# Patient Record
Sex: Female | Born: 1985 | Hispanic: No | Marital: Married | Smoking: Never smoker
Health system: Southern US, Community
[De-identification: ages and names within clinical notes are randomized; demographics above are authoritative.]

## PROBLEM LIST (undated history)

## (undated) ENCOUNTER — Inpatient Hospital Stay (HOSPITAL_COMMUNITY): Payer: Self-pay

## (undated) DIAGNOSIS — Z8669 Personal history of other diseases of the nervous system and sense organs: Secondary | ICD-10-CM

## (undated) DIAGNOSIS — Z862 Personal history of diseases of the blood and blood-forming organs and certain disorders involving the immune mechanism: Secondary | ICD-10-CM

## (undated) DIAGNOSIS — A0472 Enterocolitis due to Clostridium difficile, not specified as recurrent: Secondary | ICD-10-CM

## (undated) DIAGNOSIS — D649 Anemia, unspecified: Secondary | ICD-10-CM

## (undated) DIAGNOSIS — F32A Depression, unspecified: Secondary | ICD-10-CM

## (undated) DIAGNOSIS — R51 Headache: Secondary | ICD-10-CM

## (undated) DIAGNOSIS — R519 Headache, unspecified: Secondary | ICD-10-CM

## (undated) DIAGNOSIS — B2790 Infectious mononucleosis, unspecified without complication: Secondary | ICD-10-CM

## (undated) DIAGNOSIS — L409 Psoriasis, unspecified: Secondary | ICD-10-CM

## (undated) DIAGNOSIS — F329 Major depressive disorder, single episode, unspecified: Secondary | ICD-10-CM

## (undated) DIAGNOSIS — T7840XA Allergy, unspecified, initial encounter: Secondary | ICD-10-CM

## (undated) HISTORY — DX: Major depressive disorder, single episode, unspecified: F32.9

## (undated) HISTORY — PX: WISDOM TOOTH EXTRACTION: SHX21

## (undated) HISTORY — DX: Personal history of diseases of the blood and blood-forming organs and certain disorders involving the immune mechanism: Z86.2

## (undated) HISTORY — DX: Personal history of other diseases of the nervous system and sense organs: Z86.69

## (undated) HISTORY — DX: Allergy, unspecified, initial encounter: T78.40XA

## (undated) HISTORY — DX: Infectious mononucleosis, unspecified without complication: B27.90

## (undated) HISTORY — DX: Psoriasis, unspecified: L40.9

## (undated) HISTORY — DX: Depression, unspecified: F32.A

---
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
## 2008-06-03 DIAGNOSIS — B2790 Infectious mononucleosis, unspecified without complication: Secondary | ICD-10-CM

## 2008-06-03 HISTORY — DX: Infectious mononucleosis, unspecified without complication: B27.90

## 2008-12-01 HISTORY — PX: BREAST ENHANCEMENT SURGERY: SHX7

## 2009-08-05 ENCOUNTER — Ambulatory Visit: Payer: Self-pay | Admitting: Family Medicine

## 2009-08-05 DIAGNOSIS — R51 Headache: Secondary | ICD-10-CM | POA: Insufficient documentation

## 2009-08-05 DIAGNOSIS — R197 Diarrhea, unspecified: Secondary | ICD-10-CM

## 2009-08-05 DIAGNOSIS — F329 Major depressive disorder, single episode, unspecified: Secondary | ICD-10-CM | POA: Insufficient documentation

## 2009-08-05 DIAGNOSIS — R519 Headache, unspecified: Secondary | ICD-10-CM | POA: Insufficient documentation

## 2009-08-05 DIAGNOSIS — J029 Acute pharyngitis, unspecified: Secondary | ICD-10-CM | POA: Insufficient documentation

## 2009-08-05 DIAGNOSIS — F411 Generalized anxiety disorder: Secondary | ICD-10-CM | POA: Insufficient documentation

## 2009-08-06 ENCOUNTER — Encounter: Payer: Self-pay | Admitting: Family Medicine

## 2009-08-06 LAB — CONVERTED CEMR LAB
Alkaline Phosphatase: 51 units/L (ref 39–117)
Basophils Absolute: 0 10*3/uL (ref 0.0–0.1)
Creatinine, Ser: 0.77 mg/dL (ref 0.40–1.20)
Eosinophils Relative: 3 % (ref 0–5)
Glucose, Bld: 96 mg/dL (ref 70–99)
HCT: 40.8 % (ref 36.0–46.0)
Lymphocytes Relative: 21 % (ref 12–46)
Neutro Abs: 5 10*3/uL (ref 1.7–7.7)
Platelets: 251 10*3/uL (ref 150–400)
RDW: 13.6 % (ref 11.5–15.5)
Sodium: 141 meq/L (ref 135–145)
Total Bilirubin: 0.2 mg/dL — ABNORMAL LOW (ref 0.3–1.2)
Total Protein: 6.2 g/dL (ref 6.0–8.3)

## 2009-08-07 ENCOUNTER — Encounter: Payer: Self-pay | Admitting: Family Medicine

## 2009-08-13 ENCOUNTER — Encounter: Payer: Self-pay | Admitting: Family Medicine

## 2010-07-03 NOTE — Miscellaneous (Signed)
Summary: Directives and Consents  Directives and Consents   Imported By: Joanne Chars CMA 08/13/2009 17:03:02  _____________________________________________________________________  External Attachment:    Type:   Image     Comment:   External Document

## 2010-07-03 NOTE — Assessment & Plan Note (Signed)
Summary: Ear pain-Both/popping, sinus pressure x 2 dys rm 3   Vital Signs:  Patient Profile:   25 Years Old Female CC:      Cold & URI symptoms Height:     66 inches Weight:      111 pounds O2 Sat:      100 % O2 treatment:    Room Air Temp:     97.1 degrees F oral Pulse rate:   85 / minute Pulse rhythm:   regular Resp:     16 per minute BP sitting:   111 / 72  (right arm) Cuff size:   regular  Vitals Entered By: Areta Haber CMA (August 05, 2009 9:17 AM)                  Current Allergies (reviewed today): ! NEOMYCIN    History of Present Illness Chief Complaint: Cold & URI symptoms History of Present Illness: Subjective:  Patient reports that she was treated with two courses of antibiotic about 2 months ago for presumed strep throat.  She subsequently developed bloody stools and was empirically treated with Flagyl which she finished recently.  During the past month she has had recurring right upper quadrant pain.  She reports that her bloody stools ceased after finishing the Flagyl. About 3 days ago she developed fever/chills followed by a recurrent sore throat.  She has again developed diarrhea and has noted blood in her stool.  No nasal congestion, cough, GU, or GYN symptoms   Current Problems: DIARRHEA, BLOODY (ICD-787.91) ACUTE PHARYNGITIS (ICD-462) FAMILY HISTORY DEPRESSION (ICD-V17.0) HEADACHE (ICD-784.0) DEPRESSION (ICD-311) ANXIETY (ICD-300.00)   Current Meds PRISTIQ 50 MG XR24H-TAB (DESVENLAFAXINE SUCCINATE) 1 tab by mouth once daily TOPAMAX 100 MG TABS (TOPIRAMATE) 1 tab by mouth once daily  REVIEW OF SYSTEMS Constitutional Symptoms      Denies fever, chills, night sweats, weight loss, weight gain, and fatigue.  Eyes       Denies change in vision, eye pain, eye discharge, glasses, contact lenses, and eye surgery. Ear/Nose/Throat/Mouth       Complains of ear pain and sinus problems.      Denies hearing loss/aids, change in hearing, ear discharge,  dizziness, frequent runny nose, frequent nose bleeds, sore throat, hoarseness, and tooth pain or bleeding.      Comments: B-popping x 2 dys Respiratory       Denies dry cough, productive cough, wheezing, shortness of breath, asthma, bronchitis, and emphysema/COPD.  Cardiovascular       Denies murmurs, chest pain, and tires easily with exhertion.    Gastrointestinal       Denies stomach pain, nausea/vomiting, diarrhea, constipation, blood in bowel movements, and indigestion. Genitourniary       Denies painful urination, kidney stones, and loss of urinary control. Neurological       Complains of headaches.      Denies paralysis, seizures, and fainting/blackouts. Musculoskeletal       Denies muscle pain, joint pain, joint stiffness, decreased range of motion, redness, swelling, muscle weakness, and gout.  Skin       Denies bruising, unusual mles/lumps or sores, and hair/skin or nail changes.  Psych       Denies mood changes, temper/anger issues, anxiety/stress, speech problems, depression, and sleep problems. Other Comments: Pt has not seen PCP for this.   Past History:  Past Medical History: Anxiety Depression Headache BCP  Past Surgical History: Breast Augmentation  Family History: Family History Depression  Social History: Single Never Smoked  Alcohol use-no Drug use-no Regular exercise-yes Smoking Status:  never Drug Use:  no Does Patient Exercise:  yes   Objective:  No acute distress  Eyes:  Pupils are equal, round, and reactive to light and accomdation.  Extraocular movement is intact.  Conjunctivae are not inflamed.  Nose:  No congestion or sinus tenderness Pharynx:  Normal  Neck:  Supple.  No adenopathy is present.  No thyromegaly is present  Lungs:  Clear to auscultation.  Breath sounds are equal.  Heart:  Regular rate and rhythm without murmurs, rubs, or gallops.  Abdomen:   Vague tenderness to palpation over the descending colon without masses or  hepatosplenomegaly.  Bowel sounds are present.  No CVA or flank tenderness.  Assessment New Problems: DIARRHEA, BLOODY (ICD-787.91) ACUTE PHARYNGITIS (ICD-462) FAMILY HISTORY DEPRESSION (ICD-V17.0) HEADACHE (ICD-784.0) DEPRESSION (ICD-311) ANXIETY (ICD-300.00)  CONCERNED ABOUT POSSIBLE UNDERLYING INFLAMMATORY BOWEL CONDITION.  ? C. DIFFICILE COLITIS RECURRENT  Plan New Orders: T-Comprehensive Metabolic Panel [80053-22900] T-CBC w/Diff [[id number]] T-Sed Rate (Automated) [[id number]] T-Culture, Stool [[id number]] T-Culture, Throat [[id number]] T-Fecal Lactoferrin [70400] T-Ova and Parasites [70340] T-TSH [[id number]] New Patient Level III [99203] Planning Comments:   Throat culture pending. Return stool specimen for culture, O and P, Lactoferrin. Begin clear liquids through tomorrow (recommend oral rehydration solution such as Pedialyte)  then slowly advance diet.  Avoid milk products until diarrhea resolved.  No antibiotics for now.  No Imodium for now Check CBC, CMP, Sed rate, TSH Refer to gastroenterologist.   The patient and/or caregiver has been counseled thoroughly with regard to medications prescribed including dosage, schedule, interactions, rationale for use, and possible side effects and they verbalize understanding.  Diagnoses and expected course of recovery discussed and will return if not improved as expected or if the condition worsens. Patient and/or caregiver verbalized understanding.

## 2010-07-03 NOTE — Letter (Signed)
Summary: Handout Printed  Printed Handout:  - Clear Liquid Diet-Brief 

## 2014-05-28 ENCOUNTER — Encounter: Payer: Self-pay | Admitting: *Deleted

## 2014-05-28 ENCOUNTER — Emergency Department
Admission: EM | Admit: 2014-05-28 | Discharge: 2014-05-28 | Disposition: A | Discharge status: Other Acute Inpatient Hospital | Payer: BC Managed Care – PPO | Source: Home / Self Care | Attending: Emergency Medicine | Admitting: Emergency Medicine

## 2014-05-28 ENCOUNTER — Emergency Department (HOSPITAL_COMMUNITY): Payer: BC Managed Care – PPO

## 2014-05-28 ENCOUNTER — Encounter (HOSPITAL_COMMUNITY): Payer: Self-pay | Admitting: *Deleted

## 2014-05-28 ENCOUNTER — Emergency Department (HOSPITAL_COMMUNITY)
Admission: EM | Admit: 2014-05-28 | Discharge: 2014-05-28 | Disposition: A | Discharge status: Home / Self Care | Payer: BC Managed Care – PPO | Attending: Emergency Medicine | Admitting: Emergency Medicine

## 2014-05-28 DIAGNOSIS — R05 Cough: Secondary | ICD-10-CM

## 2014-05-28 DIAGNOSIS — Z8719 Personal history of other diseases of the digestive system: Secondary | ICD-10-CM | POA: Diagnosis not present

## 2014-05-28 DIAGNOSIS — Z79899 Other long term (current) drug therapy: Secondary | ICD-10-CM | POA: Insufficient documentation

## 2014-05-28 DIAGNOSIS — Z8619 Personal history of other infectious and parasitic diseases: Secondary | ICD-10-CM | POA: Insufficient documentation

## 2014-05-28 DIAGNOSIS — R509 Fever, unspecified: Secondary | ICD-10-CM

## 2014-05-28 DIAGNOSIS — R059 Cough, unspecified: Secondary | ICD-10-CM

## 2014-05-28 DIAGNOSIS — J159 Unspecified bacterial pneumonia: Secondary | ICD-10-CM | POA: Insufficient documentation

## 2014-05-28 DIAGNOSIS — J189 Pneumonia, unspecified organism: Secondary | ICD-10-CM

## 2014-05-28 DIAGNOSIS — R51 Headache: Secondary | ICD-10-CM | POA: Diagnosis not present

## 2014-05-28 DIAGNOSIS — R1110 Vomiting, unspecified: Secondary | ICD-10-CM

## 2014-05-28 DIAGNOSIS — E860 Dehydration: Secondary | ICD-10-CM

## 2014-05-28 HISTORY — DX: Enterocolitis due to Clostridium difficile, not specified as recurrent: A04.72

## 2014-05-28 LAB — BASIC METABOLIC PANEL
Anion gap: 7 (ref 5–15)
BUN: 7 mg/dL (ref 6–23)
CHLORIDE: 105 meq/L (ref 96–112)
CO2: 22 mmol/L (ref 19–32)
Calcium: 8 mg/dL — ABNORMAL LOW (ref 8.4–10.5)
Creatinine, Ser: 0.79 mg/dL (ref 0.50–1.10)
GFR calc Af Amer: >90 mL/min (ref >90)
GFR calc non Af Amer: >90 mL/min (ref >90)
GLUCOSE: 103 mg/dL — AB (ref 70–99)
POTASSIUM: 3.8 mmol/L (ref 3.5–5.1)
SODIUM: 134 mmol/L — AB (ref 135–145)

## 2014-05-28 LAB — CBC WITH DIFFERENTIAL/PLATELET
Basophils Absolute: 0 10*3/uL (ref 0.0–0.1)
Basophils Relative: 1 % (ref 0–1)
Eosinophils Absolute: 0 10*3/uL (ref 0.0–0.7)
Eosinophils Relative: 0 % (ref 0–5)
HCT: 37.5 % (ref 36.0–46.0)
Hemoglobin: 12.6 g/dL (ref 12.0–15.0)
LYMPHS ABS: 0.9 10*3/uL (ref 0.7–4.0)
Lymphocytes Relative: 18 % (ref 12–46)
MCH: 29.3 pg (ref 26.0–34.0)
MCHC: 33.6 g/dL (ref 30.0–36.0)
MCV: 87.2 fL (ref 78.0–100.0)
Monocytes Absolute: 0.6 10*3/uL (ref 0.1–1.0)
Monocytes Relative: 13 % — ABNORMAL HIGH (ref 3–12)
NEUTROS ABS: 3.3 10*3/uL (ref 1.7–7.7)
NEUTROS PCT: 68 % (ref 43–77)
PLATELETS: 128 10*3/uL — AB (ref 150–400)
RBC: 4.3 MIL/uL (ref 3.87–5.11)
RDW: 13.6 % (ref 11.5–15.5)
WBC: 4.8 10*3/uL (ref 4.0–10.5)

## 2014-05-28 LAB — POCT INFLUENZA A/B
Influenza A, POC: NEGATIVE
Influenza B, POC: NEGATIVE

## 2014-05-28 MED ORDER — LEVOFLOXACIN 750 MG PO TABS
750.0000 mg | ORAL_TABLET | Freq: Once | ORAL | Status: AC
  Administered 2014-05-28: 750 mg via ORAL
  Filled 2014-05-28: qty 1

## 2014-05-28 MED ORDER — ACETAMINOPHEN 325 MG PO TABS
650.0000 mg | ORAL_TABLET | Freq: Four times a day (QID) | ORAL | Status: DC | PRN
  Filled 2014-05-28: qty 2

## 2014-05-28 MED ORDER — IBUPROFEN 800 MG PO TABS
800.0000 mg | ORAL_TABLET | Freq: Once | ORAL | Status: AC
Start: 2014-05-28 — End: 2014-05-28
  Administered 2014-05-28: 800 mg via ORAL
  Filled 2014-05-28: qty 1

## 2014-05-28 MED ORDER — ONDANSETRON 8 MG PO TBDP
8.0000 mg | ORAL_TABLET | Freq: Once | ORAL | Status: AC
  Administered 2014-05-28: 8 mg via ORAL
  Filled 2014-05-28: qty 1

## 2014-05-28 MED ORDER — LEVOFLOXACIN 750 MG PO TABS: 750.0000 mg | ORAL_TABLET | Freq: Every day | ORAL | Status: DC

## 2014-05-28 MED ORDER — PROMETHAZINE HCL 25 MG/ML IJ SOLN
25.0000 mg | Freq: Once | INTRAMUSCULAR | Status: AC
  Administered 2014-05-28: 25 mg via INTRAMUSCULAR

## 2014-05-28 MED ORDER — SODIUM CHLORIDE 0.9 % IV BOLUS (SEPSIS)
2000.0000 mL | Freq: Once | INTRAVENOUS | Status: AC
  Administered 2014-05-28: 2000 mL via INTRAVENOUS

## 2014-05-28 NOTE — ED Notes (Signed)
Patient began to have cold symptoms on Wednesday. She went to urgent care today because she has been feeling worse with fever with no relief from theraflu and ibuprofen. She had a flu swab done today and was told it was negative for the flu. She had some emesis at urgent care and was given IM phenergan and tylenol. She was told to come to the for further evaluation.

## 2014-05-28 NOTE — ED Notes (Signed)
Pt has had cough and fever for 4 days.  She is fatigued  And had stomach cramps when it all started which have passed.  Pt vomited when she arrived here today but states it was from the ride over.

## 2014-05-28 NOTE — Discharge Instructions (Signed)

## 2014-05-28 NOTE — ED Provider Notes (Signed)
CSN: [id number]     Arrival date & time 05/28/14  1232 History   First MD Initiated Contact with Patient 05/28/14 1457     Chief Complaint  Patient presents with  . Generalized Body Aches  . Cough  . Fever  . Dizziness     (Consider location/radiation/quality/duration/timing/severity/associated sxs/prior Treatment) Patient is a 28 y.o. female presenting with cough.  Cough Cough characteristics:  Productive Sputum characteristics:  Yellow Severity:  Moderate Onset quality:  Gradual Duration:  3 days Timing:  Constant Progression:  Worsening Chronicity:  New Smoker: no   Context: not upper respiratory infection   Relieved by:  Nothing Worsened by:  Nothing tried Associated symptoms: chills, fever (tmax 104.7), headaches (with fever spikes, resolving with improvement), rhinorrhea and sinus congestion   Associated symptoms: no chest pain and no shortness of breath     Past Medical History  Diagnosis Date  . Colitis   . Colitis   . Clostridium difficile colitis    History reviewed. No pertinent past surgical history. No family history on file. History  Substance Use Topics  . Smoking status: Never Smoker   . Smokeless tobacco: Never Used  . Alcohol Use: No   OB History    No data available     Review of Systems  Constitutional: Positive for fever (tmax 104.7) and chills.  HENT: Positive for rhinorrhea.   Respiratory: Positive for cough. Negative for shortness of breath.   Cardiovascular: Negative for chest pain.  Neurological: Positive for headaches (with fever spikes, resolving with improvement).  All other systems reviewed and are negative.     Allergies  Neomycin; Neosporin; and Vancomycin  Home Medications   Prior to Admission medications   Medication Sig Start Date End Date Taking? Authorizing Provider  acetaminophen (TYLENOL) 325 MG tablet Take 650 mg by mouth once.   Yes Historical Provider, MD  ibuprofen (ADVIL,MOTRIN) 200 MG tablet Take 800 mg  by mouth every 6 (six) hours as needed for moderate pain.   Yes Historical Provider, MD  Phenylephrine-Pheniramine-DM [facility](THERAFLU COLD & COUGH PO) Take 20 mLs by mouth every 6 (six) hours as needed (cough).   Yes Historical Provider, MD  promethazine (PHENERGAN) 25 MG/ML injection Inject 25 mg into the vein once.   Yes Historical Provider, MD  levofloxacin (LEVAQUIN) 750 MG tablet Take 1 tablet (750 mg total) by mouth daily. X 7 days 05/28/14   Mirian MoMatthew Tashima Scarpulla, MD   BP 102/53 mmHg  Pulse 89  Temp(Src) 102.1 F (38.9 C) (Oral)  Resp 20  SpO2 97%  LMP 05/07/2014 Physical Exam  Constitutional: She is oriented to person, place, and time. She appears well-developed and well-nourished.  HENT:  Head: Normocephalic and atraumatic.  Right Ear: External ear normal.  Left Ear: External ear normal.  Eyes: Conjunctivae and EOM are normal. Pupils are equal, round, and reactive to light.  Neck: Normal range of motion. Neck supple.  Cardiovascular: Normal rate, regular rhythm, normal heart sounds and intact distal pulses.   Pulmonary/Chest: Effort normal. She has rhonchi in the left middle field and the left lower field.  Abdominal: Soft. Bowel sounds are normal. There is no tenderness.  Musculoskeletal: Normal range of motion.  Neurological: She is alert and oriented to person, place, and time.  Skin: Skin is warm and dry.  Vitals reviewed.   ED Course  Procedures (including critical care time) Labs Review Labs Reviewed  CBC WITH DIFFERENTIAL - Abnormal; Notable for the following:    Platelets 128 (*)  Monocytes Relative 13 (*)    All other components within normal limits  BASIC METABOLIC PANEL - Abnormal; Notable for the following:    Sodium 134 (*)    Glucose, Bld 103 (*)    Calcium 8.0 (*)    All other components within normal limits    Imaging Review Dg Chest 2 View (if Patient Has Fever And/or Copd)  05/28/2014   CLINICAL DATA:  Generalized body aches with cough, fever and  dizziness 3 days.  EXAM: CHEST  2 VIEW  COMPARISON:  08/16/2009  FINDINGS: Lungs are adequately inflated with focal consolidation over the medial superior segment left lower lobe likely pneumonia. No evidence of effusion or pneumothorax.  IMPRESSION: Consolidation over the posterior medial superior segment left lower lobe likely a pneumonia. Recommend follow-up to resolution.   Electronically Signed   By: Elberta Fortisaniel  Boyle M.D.   On: 05/28/2014 14:06     EKG Interpretation None      MDM   Final diagnoses:  Community acquired pneumonia    28 y.o. female with pertinent PMH of recurrent viral URI presents with 3 days of cough, high fever. Patient sent from PCP with concern for pneumonia. On arrival vital signs physical exam as above. X-ray with a left lower lobe pneumonia. Patient well-appearing, not hypoxic. Feel her discharge with outpatient therapy appropriate. Given strict return precautions for worsening symptoms. Discharged home in stable condition.    I have reviewed all laboratory and imaging studies if ordered as above  1. Community acquired pneumonia         Mirian MoMatthew Kagan Mutchler, MD 05/28/14 [phone]

## 2014-05-28 NOTE — ED Notes (Signed)
MD Gentry at bedside.

## 2014-05-28 NOTE — ED Provider Notes (Signed)
CSN: [id number]     Arrival date & time 05/28/14  1027 History   First MD Initiated Contact with Patient 05/28/14 1048    [facility] Chief Complaint  Patient presents with  . Fever  . Cough    The history is provided by the patient and the spouse.  Pt has had cough and progressively worsening fever and chills for 4 days. She is fatigued, lightheaded. Not tolerating much by mouth. And had stomach cramps when it all started , but abdominal pain improved after normal BM yesterday..  Pt vomited when she arrived here today in urgent care, and we immediately evaluated her.  I evaluated patient stat. Rapid flu tests ordered, negative for influenza A or B.   Phenergan 25 mg IM stat. Tylenol by mouth given here.  Hx C Diff Colitis in 2011.---Denies diarrhea. BM yesterday relieved abdominal pain. It was formed, had slight amount of mucus. No blood   Past Medical History  Diagnosis Date  . Colitis   . Colitis   . Clostridium difficile colitis    History reviewed. No pertinent past surgical history. History reviewed. No pertinent family history. History  Substance Use Topics  . Smoking status: Never Smoker   . Smokeless tobacco: Never Used  . Alcohol Use: No   OB History    No data available     Review of Systems  Constitutional: Positive for fever, chills, diaphoresis and fatigue.  Respiratory: Negative for shortness of breath.   Cardiovascular: Negative for chest pain.  Neurological: Positive for light-headedness. Negative for seizures and syncope.  Hematological: Does not bruise/bleed easily.  Psychiatric/Behavioral: The patient is nervous/anxious.     Allergies  Neomycin; Neosporin; and Vancomycin  Home Medications   Prior to Admission medications   Medication Sig Start Date End Date Taking? Authorizing Provider  acetaminophen (TYLENOL) 325 MG tablet Take 650 mg by mouth once.    Historical Provider, MD  ibuprofen  (ADVIL,MOTRIN) 200 MG tablet Take 800 mg by mouth every 6 (six) hours as needed for moderate pain.    Historical Provider, MD  levofloxacin (LEVAQUIN) 750 MG tablet Take 1 tablet (750 mg total) by mouth daily. X 7 days 05/28/14   Mirian MoMatthew Gentry, MD  Phenylephrine-Pheniramine-DM [facility](THERAFLU COLD & COUGH PO) Take 20 mLs by mouth every 6 (six) hours as needed (cough).    Historical Provider, MD  promethazine (PHENERGAN) 25 MG/ML injection Inject 25 mg into the vein once.    Historical Provider, MD   BP 108/66 mmHg  Pulse 108  Temp(Src) 103.1 F (39.5 C)  Ht 5\' 7"  (1.702 m)  Wt 125 lb (56.7 kg)  BMI 19.57 kg/m2  SpO2 97%  LMP 05/07/2014 Physical Exam  Constitutional: She is oriented to person, place, and time. She appears well-developed and well-nourished. She appears distressed.  Appears mildly toxic, vomiting  HENT:  Head: Normocephalic and atraumatic.  Mildly dry oral mucous membranes.  Eyes: Conjunctivae and EOM are normal. Pupils are equal, round, and reactive to light. No scleral icterus.  Neck: Normal range of motion. Neck supple. No JVD present.  Cardiovascular: Normal rate.   Tachycardic, regular rhythm  Pulmonary/Chest: No respiratory distress.  Auscultation difficult, few scattered rhonchi. She was unable to sit up to auscultate further.  Abdominal: She exhibits no distension. There is no tenderness.  Musculoskeletal: Normal range of motion.  Neurological: She is alert and oriented to person, place, and time. No cranial nerve deficit.  Skin: Skin is  warm. No rash noted. She is diaphoretic.  Psychiatric: She has a normal mood and affect.  Nursing note and vitals reviewed.   ED Course  Procedures (including critical care time) Labs Review Labs Reviewed  POCT INFLUENZA A/B   negative   MDM   1. Intractable vomiting with nausea, vomiting of unspecified type   2. Febrile illness, acute   3. Cough   4. Dehydration    Please see details above. We tried giving Phenergan  25 mg IM, but that did not significantly improve nausea and vomiting. Rapid flu test negative. Explained to patient and husband that she has signs of dehydration, and I cannot rule out pneumonia at this time. There are other possibilities in her differential for infection that might cause her symptoms. Advised husband to drive patient to the emergency room for further evaluation and treatment.--They declined ambulance for transport. Husband driving patient to emergency room now.    Lajean Manesavid Massey, MD 05/28/14 2218

## 2014-05-31 ENCOUNTER — Encounter: Payer: Self-pay | Admitting: Physician Assistant

## 2014-05-31 ENCOUNTER — Ambulatory Visit (INDEPENDENT_AMBULATORY_CARE_PROVIDER_SITE_OTHER): Payer: BC Managed Care – PPO | Admitting: Physician Assistant

## 2014-05-31 ENCOUNTER — Ambulatory Visit (INDEPENDENT_AMBULATORY_CARE_PROVIDER_SITE_OTHER): Payer: BC Managed Care – PPO

## 2014-05-31 VITALS — BP 120/82 | HR 70 | Temp 98.0°F | Ht 67.0 in | Wt 122.0 lb

## 2014-05-31 DIAGNOSIS — J189 Pneumonia, unspecified organism: Secondary | ICD-10-CM | POA: Diagnosis not present

## 2014-05-31 DIAGNOSIS — R509 Fever, unspecified: Secondary | ICD-10-CM

## 2014-05-31 DIAGNOSIS — R05 Cough: Secondary | ICD-10-CM

## 2014-05-31 MED ORDER — LEVOFLOXACIN 750 MG PO TABS: 750.0000 mg | ORAL_TABLET | Freq: Every day | ORAL | Status: DC

## 2014-05-31 MED ORDER — IPRATROPIUM-ALBUTEROL 0.5-2.5 (3) MG/3ML IN SOLN
3.0000 mL | RESPIRATORY_TRACT | Status: DC
  Administered 2014-05-31: 3 mL via RESPIRATORY_TRACT

## 2014-05-31 MED ORDER — HYDROCODONE-HOMATROPINE 5-1.5 MG/5ML PO SYRP: 5.0000 mL | ORAL_SOLUTION | Freq: Every evening | ORAL | Status: DC | PRN

## 2014-05-31 MED ORDER — ALBUTEROL SULFATE 108 (90 BASE) MCG/ACT IN AEPB: 2.0000 | INHALATION_SPRAY | Freq: Four times a day (QID) | RESPIRATORY_TRACT | Status: DC | PRN

## 2014-05-31 NOTE — Progress Notes (Signed)
Subjective:    Patient ID: Helen Perkins, female    DOB: [birth year], 28 y.o.   MRN: [id number]  HPI  Pt is a 28 yo female who is a new patient. She would like to establish care and follow up from ER visit on 05/28/2014 where she was diagnosed with pneumonia. She is doing some better but still very fatigued and has an ongoing cough. She was started on levaquin but has only had 3 doses. She is concerned because she tends to get really sick. She has been on multiple antibiotics for bronchitis. At one point in 2011 she got c.diff from all of her abx usage. Her fever is still running 101-102 but does respond to motrin.   .. Active Ambulatory Problems    Diagnosis Date Noted  . ANXIETY 08/05/2009  . DEPRESSION 08/05/2009  . ACUTE PHARYNGITIS 08/05/2009  . HEADACHE 08/05/2009  . DIARRHEA, BLOODY 08/05/2009   Resolved Ambulatory Problems    Diagnosis Date Noted  . No Resolved Ambulatory Problems   Past Medical History  Diagnosis Date  . Colitis   . Colitis   . Clostridium difficile colitis    .Marland Kitchen. Family History  Problem Relation Age of Onset  . Depression Mother   . Diabetes Maternal Uncle   . Hyperlipidemia Maternal Uncle   . Diabetes Maternal Grandmother   . Hyperlipidemia Maternal Grandmother   . Hypertension Maternal Grandmother   . Hypertension Maternal Grandfather   . Heart attack Paternal Grandmother   . Depression Paternal Grandfather   . Heart attack Paternal Grandfather    .Marland Kitchen. History   Social History  . Marital Status: Married    Spouse Name: N/A    Number of Children: N/A  . Years of Education: N/A   Occupational History  . Not on file.   Social History Main Topics  . Smoking status: Never Smoker   . Smokeless tobacco: Never Used  . Alcohol Use: No  . Drug Use: No  . Sexual Activity: Yes   Other Topics Concern  . Not on file   Social History Narrative        Review of Systems  All other systems reviewed and are negative.       Objective:   Physical Exam  Constitutional: She is oriented to person, place, and time. She appears well-developed and well-nourished.  HENT:  Head: Normocephalic and atraumatic.  Right Ear: External ear normal.  Left Ear: External ear normal.  Nose: Nose normal.  Mouth/Throat: Oropharynx is clear and moist.  Eyes: Conjunctivae are normal.  Neck: Normal range of motion. Neck supple.  Cardiovascular: Normal rate, regular rhythm and normal heart sounds.   Pulmonary/Chest: Effort normal.  Crackles heard over left middle of lungs.   Lymphadenopathy:    She has no cervical adenopathy.  Neurological: She is alert and oriented to person, place, and time.  Skin: Skin is dry.  Psychiatric: She has a normal mood and affect. Her behavior is normal.          Assessment & Plan:  CAP- still hearing crackles on lung exam.  Will get repeat CXR today and repeat CBC.  Pulse ox reassuring. Temperature responding to motrin. duoneb given in office today with some improvement. Given albuterol inhaler to use as needed for SOB and cough up to every 2-6 hours. Continue on levaquin sent encough to take for 10 days. Pt not getting rest at night. Gave hycodan for bedtime only. Pt was given incentive spironmetry to use during  the day to keep lung function open with air movement. i still think levaquin needs some time to work. Follow up with Dr. Linford Arnoldmetheney on Thursday since I will be off to make sure some improvement. Explained to patient that pneumonia can take time to recoup from.   We did discuss testing her immunoglobin function at future visit to see if there is a reason for her weak immune system.

## 2014-05-31 NOTE — Patient Instructions (Addendum)
mucinex 1200mg  twice a day.  Continue levaquin.  Albuterol inhaler 2 puffs every 2-6 hours.  Hycodan

## 2014-06-01 LAB — CBC WITH DIFFERENTIAL/PLATELET
BASOS PCT: 1 % (ref 0–1)
Basophils Absolute: 0 10*3/uL (ref 0.0–0.1)
Eosinophils Absolute: 0 10*3/uL (ref 0.0–0.7)
Eosinophils Relative: 1 % (ref 0–5)
HEMATOCRIT: 37.2 % (ref 36.0–46.0)
Hemoglobin: 12.8 g/dL (ref 12.0–15.0)
LYMPHS ABS: 1.5 10*3/uL (ref 0.7–4.0)
Lymphocytes Relative: 44 % (ref 12–46)
MCH: 28.6 pg (ref 26.0–34.0)
MCHC: 34.4 g/dL (ref 30.0–36.0)
MCV: 83.2 fL (ref 78.0–100.0)
MONOS PCT: 15 % — AB (ref 3–12)
MPV: 11.1 fL (ref 8.6–12.4)
Monocytes Absolute: 0.5 10*3/uL (ref 0.1–1.0)
NEUTROS ABS: 1.3 10*3/uL — AB (ref 1.7–7.7)
Neutrophils Relative %: 39 % — ABNORMAL LOW (ref 43–77)
Platelets: 221 10*3/uL (ref 150–400)
RBC: 4.47 MIL/uL (ref 3.87–5.11)
RDW: 14.6 % (ref 11.5–15.5)
WBC: 3.3 10*3/uL — AB (ref 4.0–10.5)

## 2014-06-02 ENCOUNTER — Ambulatory Visit (INDEPENDENT_AMBULATORY_CARE_PROVIDER_SITE_OTHER): Payer: BC Managed Care – PPO | Admitting: Family Medicine

## 2014-06-02 ENCOUNTER — Encounter: Payer: Self-pay | Admitting: Family Medicine

## 2014-06-02 VITALS — BP 93/63 | HR 85 | Temp 98.5°F | Wt 120.0 lb

## 2014-06-02 DIAGNOSIS — J189 Pneumonia, unspecified organism: Secondary | ICD-10-CM | POA: Diagnosis not present

## 2014-06-02 NOTE — Progress Notes (Signed)
   Subjective:    Patient ID: Helen Perkins, female    DOB: 09-Jul-1985, 28 y.o.   MRN: [id number]  HPI Here today for follow-up pneumonia. Helen Perkins was diagnosed about 5 days ago. Helen Perkins's been taking Helen Perkins medications consistently without any side effects or problems. No fever since Tuesday. The Helen Perkins has been getting some hot flashes. Patient Taking mucinex BID.  Cough is non- productive. Using the IS and causes Helen Perkins to cough. Helen Perkins chest feels sore.  Getting up to 2000.  Left side of Helen Perkins neck hurts from lying around.  Helen Perkins was unable to get Helen Perkins prescription cough medicine filled as the pharmacy was completely out. Helen Perkins husband was also present for the office visit today.  Review of Systems     Objective:   Physical Exam  Constitutional: Helen Perkins is oriented to person, place, and time. Helen Perkins appears well-developed and well-nourished.  HENT:  Head: Normocephalic and atraumatic.  Right Ear: External ear normal.  Left Ear: External ear normal.  Nose: Nose normal.  Mouth/Throat: Oropharynx is clear and moist.  TMs and canals are clear.   Eyes: Conjunctivae and EOM are normal. Pupils are equal, round, and reactive to light.  Neck: Neck supple. No thyromegaly present.  Cardiovascular: Normal rate, regular rhythm and normal heart sounds.   Pulmonary/Chest: Effort normal and breath sounds normal. Helen Perkins has no wheezes.  Lymphadenopathy:    Helen Perkins has no cervical adenopathy.  Neurological: Helen Perkins is alert and oriented to person, place, and time.  Skin: Skin is warm and dry.  Psychiatric: Helen Perkins has a normal mood and affect.          Assessment & Plan:  Pneumonia-improving. Helen Perkins's been afebrile for the last 2 days which is fantastic. I think Helen Perkins's making some great improvement. Helen Perkins said yesterday Helen Perkins started to feel hungry which is great. Make sure to complete full course of a box. Okay to return to work on Monday. If Helen Perkins still feels that Helen Perkins needs a couple days to stay out to recover then please just give us a call  Monday morning and we can provide additional work note. Continue over-the-counter cough medication. Helen Perkins still has the perception for the narcotic cough medicine. Evidently the pharmacy was completely out. Helen Perkins can certainly take it to a different pharmacy if Helen Perkins would like. Based on Helen Perkins history of recurrent illnesses over the last 10 years I do think that Helen Perkins really should have further immunologic workup done.  Time spent 20 minutes, greater 50% time spent counseling. [facility].

## 2014-06-14 ENCOUNTER — Ambulatory Visit (INDEPENDENT_AMBULATORY_CARE_PROVIDER_SITE_OTHER): Payer: BC Managed Care – PPO | Admitting: Physician Assistant

## 2014-06-14 VITALS — BP 98/56 | HR 68 | Ht 67.0 in | Wt 121.0 lb

## 2014-06-14 DIAGNOSIS — R059 Cough, unspecified: Secondary | ICD-10-CM

## 2014-06-14 DIAGNOSIS — R0982 Postnasal drip: Secondary | ICD-10-CM | POA: Diagnosis not present

## 2014-06-14 DIAGNOSIS — B999 Unspecified infectious disease: Secondary | ICD-10-CM | POA: Diagnosis not present

## 2014-06-14 DIAGNOSIS — R05 Cough: Secondary | ICD-10-CM | POA: Diagnosis not present

## 2014-06-14 NOTE — Patient Instructions (Signed)
Start back on claritin and consider flonase 2 sprays daily

## 2014-06-15 ENCOUNTER — Encounter: Payer: Self-pay | Admitting: Physician Assistant

## 2014-06-15 NOTE — Progress Notes (Signed)
   Subjective:    Patient ID: Helen Perkins, female    DOB: Apr 16, 1986, 29 y.o.   MRN: [id number]  HPI Pt presents to the clinic with 2 week follow up for pneumonia. She is doing great. No SOB, fever, weakness. She does have linguring cough. Almost feels like a tickle in her throat. As long as she is on mucinex controlled but if she stops comes back. No ST, ear pain, sinus pressure.   She would like to proceed with testing to see if there is any going on with immune system since she has had a long hx of recurrent illness. Per pt c.diff, pneumonia, multiple strep throats and needed multiple rounds of antibiotics.    Review of Systems  All other systems reviewed and are negative.      Objective:   Physical Exam  Constitutional: She is oriented to person, place, and time. She appears well-developed and well-nourished.  HENT:  Head: Normocephalic and atraumatic.  Right Ear: External ear normal.  Left Ear: External ear normal.  Nose: Nose normal.  Mouth/Throat: Oropharynx is clear and moist. No oropharyngeal exudate.  Eyes: Conjunctivae are normal. Right eye exhibits no discharge. Left eye exhibits no discharge.  Neck: Normal range of motion. Neck supple.  Cardiovascular: Normal rate, regular rhythm and normal heart sounds.   Pulmonary/Chest: Effort normal and breath sounds normal. She has no wheezes.  Lymphadenopathy:    She has no cervical adenopathy.  Neurological: She is alert and oriented to person, place, and time.  Skin: Skin is dry.  Psychiatric: She has a normal mood and affect. Her behavior is normal.          Assessment & Plan:  CAP- resolved. Follow up as needed.   PND/cough- I feel like cough is being caused by post nasal drip. Discussed flonase 2 sprays daily and claritin daily. Should be able to wean off mucinex. Follow up as needed.   Recurrent illness- will refer to immunology for further testing. i do not have pt's records of all her illness. Suggested she  get a copy from previous health provider.

## 2014-06-21 ENCOUNTER — Encounter: Payer: Self-pay | Admitting: Physician Assistant

## 2014-07-04 ENCOUNTER — Ambulatory Visit (HOSPITAL_BASED_OUTPATIENT_CLINIC_OR_DEPARTMENT_OTHER)
Admission: RE | Admit: 2014-07-04 | Discharge: 2014-07-04 | Disposition: A | Discharge status: Home / Self Care | Payer: BC Managed Care – PPO | Source: Ambulatory Visit | Attending: Physician Assistant | Admitting: Physician Assistant

## 2014-07-04 ENCOUNTER — Encounter: Payer: Self-pay | Admitting: Physician Assistant

## 2014-07-04 ENCOUNTER — Ambulatory Visit (INDEPENDENT_AMBULATORY_CARE_PROVIDER_SITE_OTHER): Payer: BC Managed Care – PPO | Admitting: Physician Assistant

## 2014-07-04 ENCOUNTER — Encounter: Payer: Self-pay | Admitting: Internal Medicine

## 2014-07-04 ENCOUNTER — Ambulatory Visit: Payer: BC Managed Care – PPO | Admitting: Infectious Diseases

## 2014-07-04 VITALS — BP 107/60 | HR 87 | Temp 98.4°F | Wt 120.0 lb

## 2014-07-04 DIAGNOSIS — R1032 Left lower quadrant pain: Secondary | ICD-10-CM | POA: Diagnosis not present

## 2014-07-04 DIAGNOSIS — K5900 Constipation, unspecified: Secondary | ICD-10-CM

## 2014-07-04 DIAGNOSIS — N898 Other specified noninflammatory disorders of vagina: Secondary | ICD-10-CM | POA: Insufficient documentation

## 2014-07-04 DIAGNOSIS — R110 Nausea: Secondary | ICD-10-CM | POA: Diagnosis not present

## 2014-07-04 DIAGNOSIS — R509 Fever, unspecified: Secondary | ICD-10-CM | POA: Diagnosis not present

## 2014-07-04 LAB — CBC WITH DIFFERENTIAL/PLATELET
BASOS PCT: 0 % (ref 0–1)
Basophils Absolute: 0 10*3/uL (ref 0.0–0.1)
EOS PCT: 1 % (ref 0–5)
Eosinophils Absolute: 0 10*3/uL (ref 0.0–0.7)
HCT: 42.8 % (ref 36.0–46.0)
Hemoglobin: 13.9 g/dL (ref 12.0–15.0)
LYMPHS PCT: 23 % (ref 12–46)
Lymphs Abs: 0.9 10*3/uL (ref 0.7–4.0)
MCH: 28.2 pg (ref 26.0–34.0)
MCHC: 32.5 g/dL (ref 30.0–36.0)
MCV: 86.7 fL (ref 78.0–100.0)
MONOS PCT: 9 % (ref 3–12)
Monocytes Absolute: 0.3 10*3/uL (ref 0.1–1.0)
NEUTROS ABS: 2.5 10*3/uL (ref 1.7–7.7)
Neutrophils Relative %: 67 % (ref 43–77)
PLATELETS: 233 10*3/uL (ref 150–400)
RBC: 4.93 MIL/uL (ref 3.87–5.11)
RDW: 13.8 % (ref 11.5–15.5)
WBC: 3.8 10*3/uL — ABNORMAL LOW (ref 4.0–10.5)

## 2014-07-04 MED ORDER — BISACODYL 5 MG PO TBEC: 5.0000 mg | DELAYED_RELEASE_TABLET | Freq: Every day | ORAL | Status: DC | PRN

## 2014-07-04 NOTE — Progress Notes (Signed)
   Subjective:    Patient ID: Helen Perkins, female    DOB: February 14, 1986, 29 y.o.   MRN: [id number]  HPI  Pt is a 29 yo female who presents to the clinic with LLQ abdominal pain with nausea that started 1 day ago suddenly when she woke up. She has a long hx of infections not likely for her age so she comes in today. She does admit to running a fever all day yesterday around 101 but today around 99. She admits to be contstipated with 1-3 hard pebbles yesterday morning. This morning on the way over here she saw some purple discharge in underwear.no itching. No pain with urination. No vomiting. Taking tylenol for fever. Had colonoscopy in past never told she had diverticulosis.     Review of Systems  All other systems reviewed and are negative.      Objective:   Physical Exam  Constitutional: She is oriented to person, place, and time. She appears well-developed and well-nourished.  HENT:  Head: Normocephalic and atraumatic.  Cardiovascular: Normal rate, regular rhythm and normal heart sounds.   Pulmonary/Chest: Effort normal and breath sounds normal. She has no wheezes.  Abdominal: Soft. Bowel sounds are normal. She exhibits no distension and no mass. There is no rebound and no guarding.  Pain over LLQ to palpation. NO rebound or guarding.   Neurological: She is alert and oriented to person, place, and time.  Skin: Skin is dry.  Psychiatric: She has a normal mood and affect. Her behavior is normal.          Assessment & Plan:  Fever, LLQ pain, nausea,contstipation- unclear etiology. certainly symptoms sound consistent with likely viral stomach virus;however, pt has hx of unlikely diseases occuring in her case. She has appt with immunlogy tomorrow. STAT CBC showed low WBC making diverticulitis less likely. Pelvic ultrasound did show some free fluid which could represent a rupture ovarian cyst. Left ovary was not able to be visualized. Certainly pt has some constipation which could  cause some abdominal pain. Given dulcolax to get a bowel movement going. miralax 1 capful at bedtime. Increase water and hydration. Continue probiotic. Call if symptoms not improving/worsening/changing or not had bowel movement in 2-3 days.   Vaginal discharge- stat wet prep. Unsure of what could cause purple discharge. If not resolving and wet prep normal please follow up.

## 2014-07-04 NOTE — Patient Instructions (Addendum)
miralax 1 capful at bedtime.  ducolax for next couple of days.  Drink plenty of fluids.  Will call with CBC and U/S.

## 2014-07-05 ENCOUNTER — Encounter: Payer: Self-pay | Admitting: Internal Medicine

## 2014-07-05 ENCOUNTER — Ambulatory Visit: Payer: BC Managed Care – PPO | Admitting: Infectious Diseases

## 2014-07-05 ENCOUNTER — Ambulatory Visit (INDEPENDENT_AMBULATORY_CARE_PROVIDER_SITE_OTHER): Payer: BC Managed Care – PPO | Admitting: Internal Medicine

## 2014-07-05 VITALS — BP 106/72 | HR 160 | Temp 97.8°F | Ht 67.0 in | Wt 122.8 lb

## 2014-07-05 DIAGNOSIS — Z8719 Personal history of other diseases of the digestive system: Secondary | ICD-10-CM

## 2014-07-05 DIAGNOSIS — B999 Unspecified infectious disease: Secondary | ICD-10-CM

## 2014-07-05 DIAGNOSIS — D72819 Decreased white blood cell count, unspecified: Secondary | ICD-10-CM

## 2014-07-05 DIAGNOSIS — D696 Thrombocytopenia, unspecified: Secondary | ICD-10-CM | POA: Diagnosis not present

## 2014-07-05 DIAGNOSIS — R1032 Left lower quadrant pain: Secondary | ICD-10-CM | POA: Insufficient documentation

## 2014-07-05 DIAGNOSIS — Z8619 Personal history of other infectious and parasitic diseases: Secondary | ICD-10-CM | POA: Insufficient documentation

## 2014-07-05 DIAGNOSIS — K5900 Constipation, unspecified: Secondary | ICD-10-CM | POA: Insufficient documentation

## 2014-07-05 LAB — COMPREHENSIVE METABOLIC PANEL
ALBUMIN: 4 g/dL (ref 3.5–5.2)
ALT: 8 U/L (ref 0–35)
AST: 13 U/L (ref 0–37)
Alkaline Phosphatase: 38 U/L — ABNORMAL LOW (ref 39–117)
BUN: 10 mg/dL (ref 6–23)
CO2: 26 mEq/L (ref 19–32)
Calcium: 8.7 mg/dL (ref 8.4–10.5)
Chloride: 102 mEq/L (ref 96–112)
Creat: 0.58 mg/dL (ref 0.50–1.10)
GLUCOSE: 91 mg/dL (ref 70–99)
POTASSIUM: 4.1 meq/L (ref 3.5–5.3)
Sodium: 135 mEq/L (ref 135–145)
TOTAL PROTEIN: 6.7 g/dL (ref 6.0–8.3)
Total Bilirubin: 0.3 mg/dL (ref 0.2–1.2)

## 2014-07-05 LAB — WET PREP FOR TRICH, YEAST, CLUE
Clue Cells Wet Prep HPF POC: NONE SEEN
TRICH WET PREP: NONE SEEN
WBC, Wet Prep HPF POC: NONE SEEN
Yeast Wet Prep HPF POC: NONE SEEN

## 2014-07-05 NOTE — Progress Notes (Signed)
Patient ID: Helen Perkins, female   DOB: Dec 18, 1985, 29 y.o.   MRN: [id number]         [facility]  Reason for Consult: Recent leukopenia and a history of recurrent infections Referring Provider: Tandy Gaw, PA  Patient Active Problem List   Diagnosis Date Noted  . Leukopenia 07/05/2014    Priority: High  . Thrombocytopenia 07/05/2014    Priority: High  . Recurrent infections 06/14/2014    Priority: High  . CAP (community acquired pneumonia) 05/31/2014    Priority: High  . History of Clostridium difficile colitis 07/05/2014    Priority: Medium  . Abdominal pain, left lower quadrant 07/05/2014  . CN (constipation) 07/05/2014  . ANXIETY 08/05/2009  . DEPRESSION 08/05/2009  . HEADACHE 08/05/2009    Patient's Medications  New Prescriptions   No medications on file  Previous Medications   LORATADINE (CLARITIN) 10 MG TABLET    Take 10 mg by mouth daily.  Modified Medications   No medications on file  Discontinued Medications   ACETAMINOPHEN (TYLENOL) 325 MG TABLET    Take 650 mg by mouth once.   BISACODYL (DULCOLAX) 5 MG EC TABLET    Take 1 tablet (5 mg total) by mouth daily as needed for moderate constipation.   IBUPROFEN (ADVIL,MOTRIN) 200 MG TABLET    Take 800 mg by mouth every 6 (six) hours as needed for moderate pain.    Recommendations: 1. Continue observation off of antibiotics 2. Review past medical records 3. Check complete metabolic panel, HIV antibody and ANA 4. Follow-up in 2 months   Assessment: Although Helen Perkins recently had community-acquired pneumonia with leukopenia and a history of recurrent respiratory infections I am not certain that she has any underlying immune disorder. It is not uncommon for healthy young adults to have recurrent sore throats and sinus congestion. It sounds like some of her sinus symptoms over the past decades might be related to seasonal allergies. She's not had any documented neutropenia.  I do  not see any clear clinical evidence for HIV infection or autoimmune disorders but will check an HIV antibody and ANA. Given her history of C. difficile colitis several years ago I suggested that she reconsider her what the threshold should be for starting empiric antibiotics for potential respiratory infections. I will see her back in one to 2 months. She states that she has mounting healthcare-related bills and wants to try to limit future visits and diagnostic evaluations if it is safe to do so.  HPI: Helen Perkins is a 29 y.o. female who is referred to me for evaluation of a history of recurrent infections and recent leukopenia. She developed community-acquired pneumonia right after Christmas. Chest x-ray revealed a faint, focal infiltrate in the superior segment of the left lower lobe. She was treated with empiric antibiotics and slowly improved. Blood work obtained at that time showed some leukopenia and transient thrombocytopenia.  She states that she has a long history of recurrent sinus infections. These occur several times a year. She also has a history of sore throats. These problems date back to middle school. She states that she was hospitalized with mononucleosis for 1 week in 2010. She's also been hospitalized with C. difficile colitis for 1 week in 2012. These hospitalizations occurred at [facility].  She used to be followed at [facility]. I reviewed 145 pages of medical records dating back to 2010 from [facility]. Documentation indicated diagnoses including migraine  headaches, sinusitis, seasonal allergies, depression, and narcolepsy. A plain sinus x-ray in February 2012 showed a right maxillary air-fluid level. Follow-up CT scan in December 2014 was completely normal. Records indicate that she was frequently treated with antibiotics.   Records show 1 CBC obtained in 2011. Her white blood cell count, hemoglobin and platelets were normal at that  time. However she did have low ferritin levels. She was seen by Dr. Foy Guadalajara at the [facility] in June 2012. He was concerned that she might have some malabsorption syndrome in addition to some iron deficiency due to menstrual blood loss. She received IV iron infusions for a brief period of time.  She states that she was eventually seen by a headache specialist and was treated with scalp injections after she was diagnosed with medication overuse headache syndrome. She has not had any headaches since that time. She believes she was overmedicated and she gradually weaned herself off of antidepressants, Neurontin and Ritalin and has felt much better since that time. She does not feel depressed and her daytime sleepiness resolved.  She felt much better after being treated for pneumonia but states that she had one fever spike 2 days ago. She was seen by her new primary care provider yesterday for abdominal pain. Pelvic and transvaginal ultrasounds did not reveal any abnormalities. Vaginal wet prep was normal. Repeat CBC showed continue leukopenia with a count of 3800 and a normal differential.  She has never smoked cigarettes and does not live more work around smokers. She has no family history of unusual infections or lung disease. She has 2 lifetime female sexual contacts including her current husband who she's been with since she was a teenager. She has no known history of sexual transmitted diseases or hepatitis but states that she does not know much history about her first partner. She works as a third Psychiatrist. She has no unusual travel history or animal exposure. She gets regular exercise.   Review of Systems: Constitutional: positive for fevers, malaise and sweats, negative for anorexia, chills and weight loss Eyes: negative, wears contact lens Ears, nose, mouth, throat, and face: negative for ear drainage, earaches, nasal congestion, sore mouth and sore throat Respiratory:  negative for asthma, chronic bronchitis, dyspnea on exertion, hemoptysis and pleurisy/chest pain Cardiovascular: negative Gastrointestinal: positive for abdominal pain and constipation, negative for change in bowel habits, diarrhea, dyspepsia, dysphagia, nausea, odynophagia, reflux symptoms and vomiting Genitourinary:negative Integument/breast: negative    Past Medical History  Diagnosis Date  . Colitis   . Colitis   . Clostridium difficile colitis     History  Substance Use Topics  . Smoking status: Never Smoker   . Smokeless tobacco: Never Used  . Alcohol Use: No    Family History  Problem Relation Age of Onset  . Depression Mother   . Diabetes Maternal Uncle   . Hyperlipidemia Maternal Uncle   . Diabetes Maternal Grandmother   . Hyperlipidemia Maternal Grandmother   . Hypertension Maternal Grandmother   . Hypertension Maternal Grandfather   . Heart attack Paternal Grandmother   . Depression Paternal Grandfather   . Heart attack Paternal Grandfather    Allergies  Allergen Reactions  . Influenza Vaccines     Allergic to component.   Marland Kitchen Neomycin     REACTION: Rash  . Neosporin [Neomycin-Bacitracin Zn-Polymyx]   . Vancomycin Swelling    OBJECTIVE: Blood pressure 106/72, pulse 160, temperature 97.8 F (36.6 C), temperature source Oral, height  (1.702  m), weight 122 lb 12 oz (55.679 kg), last menstrual period 06/11/2014.   General: She is a healthy-appearing young female in no distress Skin: No rash. Specifically no evidence of psoriasis. Nail beds are normal. Lymph nodes: No palpable adenopathy Eyes: Normal external exam Oral: No oropharyngeal lesions. Teeth in good condition Lungs: Clear Cor: Regular S1 and S2 with no murmurs Abdomen: Soft and nontender. I do not palpate a liver spleen or any other masses. She has no CVA or suprapubic tenderness Joints and extremities: Normal Mood and affect: Normal she is cheerful and appropriate. She does not appear  depressed or anxious.  Lab Results  Component Value Date   WBC 3.8* 07/04/2014   HGB 13.9 07/04/2014   HCT 42.8 07/04/2014   MCV 86.7 07/04/2014   PLT 233 07/04/2014   BMET    Component Value Date/Time   NA 135 07/05/2014 1614   K 4.1 07/05/2014 1614   CL 102 07/05/2014 1614   CO2 26 07/05/2014 1614   GLUCOSE 91 07/05/2014 1614   BUN 10 07/05/2014 1614   CREATININE 0.58 07/05/2014 1614   CREATININE 0.79 05/28/2014 1358   CALCIUM 8.7 07/05/2014 1614   GFRNONAA >90 05/28/2014 1358   GFRAA >90 05/28/2014 1358     Microbiology: Recent Results (from the past 240 hour(s))  WET PREP FOR TRICH, YEAST, CLUE     Status: None   Collection Time: 07/04/14  2:43 PM  Result Value Ref Range Status   Yeast Wet Prep HPF POC NONE SEEN NONE SEEN Final   Trich, Wet Prep NONE SEEN NONE SEEN Final   Clue Cells Wet Prep HPF POC NONE SEEN NONE SEEN Final   WBC, Wet Prep HPF POC NONE SEEN NONE SEEN Final   CHEST 2 VIEW 07/29/2014  COMPARISON: 08/16/2009  FINDINGS: Lungs are adequately inflated with focal consolidation over the medial superior segment left lower lobe likely pneumonia. No evidence of effusion or pneumothorax.  IMPRESSION: Consolidation over the posterior medial superior segment left lower lobe likely a pneumonia. Recommend follow-up to resolution.   Electronically Signed  By: Elberta Fortisaniel Boyle M.D.  On: 05/28/2014 14:06  Cliffton AstersJohn Shakerria Parran, MD [facility] [phone] pager   [phone] cell 07/05/2014, 4:02 PM

## 2014-07-06 LAB — ANA: ANA: NEGATIVE

## 2014-07-06 LAB — HIV ANTIBODY (ROUTINE TESTING W REFLEX): HIV: NONREACTIVE

## 2014-09-06 ENCOUNTER — Ambulatory Visit: Payer: BC Managed Care – PPO | Admitting: Internal Medicine

## 2015-08-30 ENCOUNTER — Ambulatory Visit (INDEPENDENT_AMBULATORY_CARE_PROVIDER_SITE_OTHER): Payer: BC Managed Care – PPO | Admitting: Physician Assistant

## 2015-08-30 ENCOUNTER — Encounter: Payer: Self-pay | Admitting: Physician Assistant

## 2015-08-30 ENCOUNTER — Other Ambulatory Visit (HOSPITAL_COMMUNITY)
Admission: RE | Admit: 2015-08-30 | Discharge: 2015-08-30 | Disposition: A | Discharge status: Home / Self Care | Payer: BC Managed Care – PPO | Source: Ambulatory Visit | Attending: Family Medicine | Admitting: Family Medicine

## 2015-08-30 VITALS — BP 112/76 | HR 79 | Ht 67.0 in | Wt 118.0 lb

## 2015-08-30 DIAGNOSIS — R413 Other amnesia: Secondary | ICD-10-CM

## 2015-08-30 DIAGNOSIS — Z0000 Encounter for general adult medical examination without abnormal findings: Secondary | ICD-10-CM | POA: Diagnosis not present

## 2015-08-30 DIAGNOSIS — Z131 Encounter for screening for diabetes mellitus: Secondary | ICD-10-CM

## 2015-08-30 DIAGNOSIS — Z01419 Encounter for gynecological examination (general) (routine) without abnormal findings: Secondary | ICD-10-CM | POA: Insufficient documentation

## 2015-08-30 DIAGNOSIS — R5383 Other fatigue: Secondary | ICD-10-CM

## 2015-08-30 DIAGNOSIS — Z13220 Encounter for screening for lipoid disorders: Secondary | ICD-10-CM

## 2015-08-30 DIAGNOSIS — L409 Psoriasis, unspecified: Secondary | ICD-10-CM

## 2015-08-30 DIAGNOSIS — N920 Excessive and frequent menstruation with regular cycle: Secondary | ICD-10-CM

## 2015-08-30 LAB — COMPLETE METABOLIC PANEL WITH GFR
ALBUMIN: 4.6 g/dL (ref 3.6–5.1)
ALK PHOS: 38 U/L (ref 33–115)
ALT: 9 U/L (ref 6–29)
AST: 13 U/L (ref 10–30)
BUN: 11 mg/dL (ref 7–25)
CHLORIDE: 104 mmol/L (ref 98–110)
CO2: 26 mmol/L (ref 20–31)
CREATININE: 0.65 mg/dL (ref 0.50–1.10)
Calcium: 9.3 mg/dL (ref 8.6–10.2)
GFR, Est Non African American: >89 mL/min (ref >=60)
Glucose, Bld: 87 mg/dL (ref 65–99)
POTASSIUM: 4.4 mmol/L (ref 3.5–5.3)
Sodium: 138 mmol/L (ref 135–146)
Total Bilirubin: 0.9 mg/dL (ref 0.2–1.2)
Total Protein: 7.2 g/dL (ref 6.1–8.1)

## 2015-08-30 LAB — CBC
HEMATOCRIT: 39.4 % (ref 36.0–46.0)
Hemoglobin: 13 g/dL (ref 12.0–15.0)
MCH: 28.8 pg (ref 26.0–34.0)
MCHC: 33 g/dL (ref 30.0–36.0)
MCV: 87.4 fL (ref 78.0–100.0)
MPV: 11.6 fL (ref 8.6–12.4)
Platelets: 264 10*3/uL (ref 150–400)
RBC: 4.51 MIL/uL (ref 3.87–5.11)
RDW: 13.8 % (ref 11.5–15.5)
WBC: 5.5 10*3/uL (ref 4.0–10.5)

## 2015-08-30 LAB — VITAMIN B12: Vitamin B-12: 471 pg/mL (ref 200–1100)

## 2015-08-30 LAB — C-REACTIVE PROTEIN

## 2015-08-30 LAB — LIPID PANEL
CHOL/HDL RATIO: 2.3 ratio (ref <=5.0)
Cholesterol: 124 mg/dL — ABNORMAL LOW (ref 125–200)
HDL: 55 mg/dL (ref >=46)
LDL Cholesterol: 59 mg/dL (ref <130)
TRIGLYCERIDES: 50 mg/dL (ref <150)
VLDL: 10 mg/dL (ref <30)

## 2015-08-30 LAB — FERRITIN: FERRITIN: 30 ng/mL (ref 10–154)

## 2015-08-30 LAB — TSH: TSH: 2.01 mIU/L

## 2015-08-30 MED ORDER — FLUCONAZOLE 150 MG PO TABS: 150.0000 mg | ORAL_TABLET | Freq: Once | ORAL | Status: DC

## 2015-08-30 MED ORDER — FUSION PLUS PO CAPS: ORAL_CAPSULE | ORAL | Status: DC

## 2015-08-30 NOTE — Progress Notes (Signed)
Subjective:     Helen Perkins is a 30 y.o. female and is here for a comprehensive physical exam. The patient reports problems - see below.   She is concerned with period changes. She has always been regular and normal flow. She notices now that her periods are still regular but are getting heavier and heavier. She has some random abdominal cramping but bleeding is what is draining. For the last 3 months she has to change tampon every 30 minutes to an hour or she bleeds through her underwear. She has also noticed some night sweats during period. She is very exhausted. She has hx of anemia but cannot tolerate OTC iron.     Social History   Social History  . Marital Status: Married    Spouse Name: N/A  . Number of Children: N/A  . Years of Education: N/A   Occupational History  . Not on file.   Social History Main Topics  . Smoking status: Never Smoker   . Smokeless tobacco: Never Used  . Alcohol Use: No  . Drug Use: No  . Sexual Activity: Yes   Other Topics Concern  . Not on file   Social History Narrative   Health Maintenance  Topic Date Due  . Janet Berlin  01/27/2006  . PAP SMEAR  01/28/2008  . HIV Screening  Completed    The following portions of the patient's history were reviewed and updated as appropriate: allergies, current medications, past family history, past medical history, past social history, past surgical history and problem list.  Review of Systems Pertinent items noted in HPI and remainder of comprehensive ROS otherwise negative.   Objective:    BP 112/76 mmHg  Pulse 79  Ht  (1.702 m)  Wt 118 lb (53.524 kg)  BMI 18.48 kg/m2  LMP 08/09/2015 General appearance: alert, cooperative and appears stated age Head: Normocephalic, without obvious abnormality, atraumatic Eyes: conjunctivae/corneas clear. PERRL, EOM's intact. Fundi benign. Ears: normal TM's and external ear canals both ears Nose: Nares normal. Septum midline. Mucosa normal. No  drainage or sinus tenderness. Throat: lips, mucosa, and tongue normal; teeth and gums normal Neck: no adenopathy, no carotid bruit, no JVD, supple, symmetrical, trachea midline and thyroid not enlarged, symmetric, no tenderness/mass/nodules Back: symmetric, no curvature. ROM normal. No CVA tenderness. Lungs: clear to auscultation bilaterally Breasts: normal appearance, no masses or tenderness, bilateral implants Heart: regular rate and rhythm, S1, S2 normal, no murmur, click, rub or gallop Abdomen: soft, non-tender; bowel sounds normal; no masses,  no organomegaly Pelvic: cervix normal in appearance, external genitalia normal, no adnexal masses or tenderness, no cervical motion tenderness, uterus normal size, shape, and consistency and vagina normal without discharge Extremities: extremities normal, atraumatic, no cyanosis or edema Pulses: 2+ and symmetric Skin: Skin color, texture, turgor normal. No rashes or lesions Lymph nodes: Cervical, supraclavicular, and axillary nodes normal. Neurologic: Alert and oriented X 3, normal strength and tone. Normal symmetric reflexes. Normal coordination and gait    Assessment:    Healthy female exam.      Plan:    CPE- pap done today. STD testing declined. Fasting labs ordered. Discussed vitamin D 800 units and calcium .   Memory difficulties/no energy- reassured pt that I feel her memory concerns are normal variants. Will test labs to look for any metabolic reasons for no energy. Pt delclines and depression with PHQ-2 was 0.   Menorrhagia with regular cycle- discussed OCP. Will wait for labs to come back first. Certainly may  be anemic since she is bleeding so hard. Given fusion to see if can tolerate.  See After Visit Summary for Counseling Recommendations

## 2015-08-30 NOTE — Patient Instructions (Signed)

## 2015-08-31 LAB — SEDIMENTATION RATE: SED RATE: 1 mm/h (ref 0–20)

## 2015-08-31 LAB — VITAMIN D 25 HYDROXY (VIT D DEFICIENCY, FRACTURES): Vit D, 25-Hydroxy: 35 ng/mL (ref 30–100)

## 2015-08-31 LAB — CYTOLOGY - PAP

## 2015-09-01 DIAGNOSIS — L409 Psoriasis, unspecified: Secondary | ICD-10-CM | POA: Insufficient documentation

## 2015-09-01 MED ORDER — NORETHIN ACE-ETH ESTRAD-FE 1-20 MG-MCG PO TABS: 1.0000 | ORAL_TABLET | Freq: Every day | ORAL | Status: DC

## 2015-09-14 ENCOUNTER — Encounter: Payer: Self-pay | Admitting: Physician Assistant

## 2015-09-14 MED ORDER — FLUCONAZOLE 150 MG PO TABS
150.0000 mg | ORAL_TABLET | Freq: Once | ORAL | Status: DC
Start: 2015-09-14 — End: 2016-04-17

## 2016-04-17 ENCOUNTER — Ambulatory Visit (INDEPENDENT_AMBULATORY_CARE_PROVIDER_SITE_OTHER): Payer: BC Managed Care – PPO | Admitting: Physician Assistant

## 2016-04-17 ENCOUNTER — Encounter: Payer: Self-pay | Admitting: Physician Assistant

## 2016-04-17 ENCOUNTER — Ambulatory Visit (INDEPENDENT_AMBULATORY_CARE_PROVIDER_SITE_OTHER): Payer: BC Managed Care – PPO

## 2016-04-17 VITALS — BP 127/59 | HR 69 | Ht 67.0 in | Wt 122.0 lb

## 2016-04-17 DIAGNOSIS — N926 Irregular menstruation, unspecified: Secondary | ICD-10-CM

## 2016-04-17 DIAGNOSIS — R1032 Left lower quadrant pain: Secondary | ICD-10-CM

## 2016-04-17 DIAGNOSIS — R110 Nausea: Secondary | ICD-10-CM | POA: Diagnosis not present

## 2016-04-17 NOTE — Progress Notes (Signed)
Call pt: no fibroids or masses noted in uterus or ovaries. There was a small amt of free fluid which at times indicates an ovarian cyst has ruptured. No signs of a fetus. Will confirm with HCG levels when result.

## 2016-04-17 NOTE — Progress Notes (Signed)
   Subjective:    Patient ID: Helen Perkins, female    DOB: May 15, 1986, 30 y.o.   MRN: [id number]  HPI  Pt is a 30 yo female who presents to the clinic with missed period, abdominal cramping, mood changes, bloating for last week. LMP was 03/11/16 and she is on a very regular 28 day cycle. She is late for her period and per pt 11 days late. Last night she had a small amt of brown discharge. She has taken 5 home pregnancy test which were negative. She is only on pre-natal vitamins. She is over nauseated but no vomiting. She denies any dysuria, fever, chills, bowel changes. Her abdominal cramping is mild and tolerable. She has not taken anything to make better.     Review of Systems  Constitutional: Negative for activity change, appetite change, chills, diaphoresis, fever and unexpected weight change.  HENT: Negative.   Eyes: Negative.   Respiratory: Negative.   Cardiovascular: Negative.   Endocrine: Negative.   Neurological: Negative.   Psychiatric/Behavioral: Negative.   All other systems reviewed and are negative.      Objective:   Physical Exam  Constitutional: She is oriented to person, place, and time. She appears well-developed and well-nourished.  HENT:  Head: Normocephalic and atraumatic.  Cardiovascular: Normal rate, regular rhythm and normal heart sounds.   Pulmonary/Chest: Effort normal and breath sounds normal.  No CVA tenderness.   Abdominal: Soft. Bowel sounds are normal.  Mild tenderness to palpation in left lower quadrant. No guarding or rebound.   Neurological: She is alert and oriented to person, place, and time.  Psychiatric: She has a normal mood and affect. Her behavior is normal.          Assessment & Plan:  Marland Kitchen.Marland Kitchen.Lillia AbedLindsay was seen today for amenorrhea.  Diagnoses and all orders for this visit:  Abdominal pain, left lower quadrant -     US Pelvis Complete -     US Transvaginal Non-OB -     B-HCG Quant  Nausea without vomiting -     US Pelvis  Complete -     US Transvaginal Non-OB -     B-HCG Quant  Missed period -     US Pelvis Complete -     US Transvaginal Non-OB -     B-HCG Quant   Unclear etiology will confirm pregnancy with HCG, serum.  Will evaluate LLQ pain with u/s.

## 2016-04-18 LAB — HCG, QUANTITATIVE, PREGNANCY: hCG, Beta Chain, Quant, S: <2 m[IU]/mL

## 2017-07-01 ENCOUNTER — Ambulatory Visit (INDEPENDENT_AMBULATORY_CARE_PROVIDER_SITE_OTHER): Payer: BC Managed Care – PPO | Admitting: Physician Assistant

## 2017-07-01 ENCOUNTER — Encounter: Payer: Self-pay | Admitting: Physician Assistant

## 2017-07-01 VITALS — BP 130/92 | HR 63 | Temp 98.1°F | Wt 123.0 lb

## 2017-07-01 DIAGNOSIS — R69 Illness, unspecified: Secondary | ICD-10-CM

## 2017-07-01 DIAGNOSIS — J111 Influenza due to unidentified influenza virus with other respiratory manifestations: Secondary | ICD-10-CM

## 2017-07-01 LAB — POCT INFLUENZA A/B
INFLUENZA A, POC: NEGATIVE
Influenza B, POC: NEGATIVE

## 2017-07-01 LAB — HM PAP SMEAR: HM Pap smear: NEGATIVE

## 2017-07-01 MED ORDER — BENZONATATE 200 MG PO CAPS: 200.0000 mg | ORAL_CAPSULE | Freq: Three times a day (TID) | ORAL | 0 refills | Status: DC | PRN

## 2017-07-01 MED ORDER — IPRATROPIUM BROMIDE 0.06 % NA SOLN: 2.0000 | Freq: Four times a day (QID) | NASAL | 0 refills | Status: DC | PRN

## 2017-07-01 MED ORDER — OSELTAMIVIR PHOSPHATE 75 MG PO CAPS
75.0000 mg | ORAL_CAPSULE | Freq: Two times a day (BID) | ORAL | 0 refills | Status: DC
Start: 2017-07-01 — End: 2017-07-27

## 2017-07-01 NOTE — Progress Notes (Signed)
HPI:                                                                Helen Perkins is a 32 y.o. female who presents to [facility]: [facility] today for flu-like symptoms  Influenza  This is a new problem. The current episode started yesterday. The problem occurs constantly. The problem has been unchanged. Associated symptoms include chills, congestion, coughing, fatigue, headaches and a sore throat. Pertinent negatives include no myalgias. The symptoms are aggravated by exertion. She has tried nothing for the symptoms.      Depression screen [facility] 2/9 07/05/2014  Decreased Interest 0  Down, Depressed, Hopeless 0  PHQ - 2 Score 0    No flowsheet data found.    Past Medical History:  Diagnosis Date  . Allergy   . Clostridium difficile colitis   . Colitis   . Colitis   . Depression   . History of iron deficiency anemia   . History of migraine headaches    History of medication overuse headaches  . Mononucleosis 2010  . Psoriasis    Transient scalp psoriasis as a teenager   Past Surgical History:  Procedure Laterality Date  . BREAST ENHANCEMENT SURGERY  12/2008  . WISDOM TOOTH EXTRACTION     Social History   Tobacco Use  . Smoking status: Never Smoker  . Smokeless tobacco: Never Used  Substance Use Topics  . Alcohol use: No   family history includes Depression in her mother and paternal grandfather; Diabetes in her maternal grandmother and maternal uncle; Heart attack in her paternal grandfather and paternal grandmother; Hyperlipidemia in her maternal grandmother and maternal uncle; Hypertension in her maternal grandfather and maternal grandmother.    ROS: negative except as noted in the HPI  Medications: Current Outpatient Medications  Medication Sig Dispense Refill  . benzonatate (TESSALON) 200 MG capsule Take 1 capsule (200 mg total) by mouth 3 (three) times daily as needed for cough. 30 capsule 0  . ipratropium (ATROVENT)  0.06 % nasal spray Place 2 sprays into both nostrils 4 (four) times daily as needed. 15 mL 0  . Iron-FA-B Cmp-C-Biot-Probiotic (FUSION PLUS) CAPS Take one tablet daily 30 capsule 5  . loratadine (CLARITIN) 10 MG tablet Take 10 mg by mouth daily.    Marland Kitchen oseltamivir (TAMIFLU) 75 MG capsule Take 1 capsule (75 mg total) by mouth 2 (two) times daily. 10 capsule 0   No current facility-administered medications for this visit.    Allergies  Allergen Reactions  . Influenza Vaccines     Allergic to component.   Marland Kitchen Neomycin     REACTION: Rash  . Neosporin [Neomycin-Bacitracin Zn-Polymyx]   . Vancomycin Swelling       Objective:  BP (!) 130/92   Pulse 63   Temp 98.1 F (36.7 C) (Oral)   Wt 123 lb (55.8 kg)   SpO2 100%   BMI 19.26 kg/m  Gen:  alert, ill-appearing, not toxic-appearing, no distress, appropriate for age HEENT: head normocephalic without obvious abnormality, conjunctiva and cornea clear, oropharynx with erythema, nasal mucosa edematous, neck supple, no adenopathy, trachea midline Pulm: Normal work of breathing, normal phonation, clear to auscultation bilaterally, no wheezes, rales or rhonchi CV: Normal rate, regular rhythm,  s1 and s2 distinct, no murmurs, clicks or rubs  Neuro: alert and oriented x 3, no tremor MSK: extremities atraumatic, normal gait and station Skin: intact, no rashes on exposed skin, no jaundice, no cyanosis   Results for orders placed or performed in visit on 07/01/17 (from the past 72 hour(s))  POCT Influenza A/B     Status: Normal   Collection Time: 07/01/17  4:55 PM  Result Value Ref Range   Influenza A, POC Negative Negative   Influenza B, POC Negative Negative   No results found.    Assessment and Plan: 32 y.o. female with   1. Influenza-like illness - POCT Influenza A/B negative - patient has had multiple contacts with influenza and clinically has flu. Treating empirically with Tamiflu - continue symptomatic care - oseltamivir (TAMIFLU)  75 MG capsule; Take 1 capsule (75 mg total) by mouth 2 (two) times daily.  Dispense: 10 capsule; Refill: 0 - ipratropium (ATROVENT) 0.06 % nasal spray; Place 2 sprays into both nostrils 4 (four) times daily as needed.  Dispense: 15 mL; Refill: 0 - benzonatate (TESSALON) 200 MG capsule; Take 1 capsule (200 mg total) by mouth 3 (three) times daily as needed for cough.  Dispense: 30 capsule; Refill: 0  Patient education and anticipatory guidance given Patient agrees with treatment plan Follow-up as needed if symptoms worsen or fail to improve  Levonne Hubertharley E. Cummings PA-C

## 2017-07-01 NOTE — Patient Instructions (Signed)

## 2017-07-04 LAB — HM PAP SMEAR: HM Pap smear: NEGATIVE

## 2017-07-27 ENCOUNTER — Encounter: Payer: Self-pay | Admitting: Emergency Medicine

## 2017-07-27 ENCOUNTER — Emergency Department
Admission: EM | Admit: 2017-07-27 | Discharge: 2017-07-27 | Disposition: A | Discharge status: Home / Self Care | Payer: BC Managed Care – PPO | Source: Home / Self Care

## 2017-07-27 DIAGNOSIS — J111 Influenza due to unidentified influenza virus with other respiratory manifestations: Secondary | ICD-10-CM

## 2017-07-27 LAB — POCT INFLUENZA A/B
Influenza A, POC: POSITIVE — AB
Influenza B, POC: NEGATIVE

## 2017-07-27 MED ORDER — OSELTAMIVIR PHOSPHATE 75 MG PO CAPS: 75.0000 mg | ORAL_CAPSULE | Freq: Two times a day (BID) | ORAL | 0 refills | Status: DC

## 2017-07-27 NOTE — ED Triage Notes (Signed)
[facility] flu-like symptoms since yesterday.Fever up to 103.7 last PM

## 2017-07-28 ENCOUNTER — Encounter: Payer: Self-pay | Admitting: Physician Assistant

## 2017-07-28 NOTE — ED Provider Notes (Signed)
[facility]    CSN: [id number] Arrival date & time: 07/27/17  1423     History   Chief Complaint Chief Complaint  Patient presents with  . Influenza    HPI Helen Perkins is a 32 y.o. female.   The history is provided by the patient. No language interpreter was used.  Influenza  Presenting symptoms: cough and fever   Severity:  Moderate Onset quality:  Gradual Progression:  Worsening Chronicity:  New Relieved by:  Nothing Worsened by:  Nothing Ineffective treatments:  None tried Associated symptoms: no chills   Pt complains of cough and body aches.  Pt was exposed to influenza   Past Medical History:  Diagnosis Date  . Allergy   . Clostridium difficile colitis   . Colitis   . Colitis   . Depression   . History of iron deficiency anemia   . History of migraine headaches    History of medication overuse headaches  . Mononucleosis 2010  . Psoriasis    Transient scalp psoriasis as a teenager    Patient Active Problem List   Diagnosis Date Noted  . 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 Menorrhagia with regular cycle 08/30/2015  . Memory difficulties 08/30/2015  . No energy 08/30/2015  . Abdominal pain, left lower quadrant 07/05/2014  . CN (constipation) 07/05/2014  . Leukopenia 07/05/2014  . Thrombocytopenia (HCC) 07/05/2014  . History of Clostridium difficile colitis 07/05/2014  . Recurrent infections 06/14/2014  . ANXIETY 08/05/2009  . DEPRESSION 08/05/2009  . HEADACHE 08/05/2009    Past Surgical History:  Procedure Laterality Date  . BREAST ENHANCEMENT SURGERY  12/2008  . WISDOM TOOTH EXTRACTION      OB History    No data available       Home Medications    Prior to Admission medications   Medication Sig Start Date End Date Taking? Authorizing Provider  benzonatate (TESSALON) 200 MG capsule Take 1 capsule (200 mg total) by mouth 3 (three) times daily as needed for cough. 07/01/17   Carlis Stableummings, Charley Elizabeth, PA-C  ipratropium  (ATROVENT) 0.06 % nasal spray Place 2 sprays into both nostrils 4 (four) times daily as needed. 07/01/17   Carlis Stableummings, Charley Elizabeth, PA-C  Iron-FA-B Cmp-C-Biot-Probiotic (FUSION PLUS) CAPS Take one tablet daily 08/30/15   Breeback, Jade L, PA-C  loratadine (CLARITIN) 10 MG tablet Take 10 mg by mouth daily.    [provider]  oseltamivir (TAMIFLU) 75 MG capsule Take 1 capsule (75 mg total) by mouth every 12 (twelve) hours. 07/27/17   Elson AreasSofia, Aldair Rickel K, PA-C    Family History Family History  Problem Relation Age of Onset  . Depression Mother   . Diabetes Maternal Uncle   . Hyperlipidemia Maternal Uncle   . Diabetes Maternal Grandmother   . Hyperlipidemia Maternal Grandmother   . Hypertension Maternal Grandmother   . Hypertension Maternal Grandfather   . Heart attack Paternal Grandmother   . Depression Paternal Grandfather   . Heart attack Paternal Grandfather     Social History Social History   Tobacco Use  . Smoking status: Never Smoker  . Smokeless tobacco: Never Used  Substance Use Topics  . Alcohol use: No  . Drug use: No     Allergies   Influenza vaccines; Neomycin; Neosporin [neomycin-bacitracin zn-polymyx]; and Vancomycin   Review of Systems Review of Systems  Constitutional: Positive for fever. Negative for chills.  Respiratory: Positive for cough.   All other systems reviewed and are negative.    Physical  Exam Triage Vital Signs ED Triage Vitals  Enc Vitals Group     BP 07/27/17 1519 105/70     Pulse Rate 07/27/17 1519 82     Resp 07/27/17 1519 16     Temp 07/27/17 1519 99.2 F (37.3 C)     Temp Source 07/27/17 1519 Oral     SpO2 07/27/17 1519 98 %     Weight 07/27/17 1520 120 lb (54.4 kg)     Height 07/27/17 1520 5\' 6"  (1.676 m)     Head Circumference --      Peak Flow --      Pain Score 07/27/17 1520 4     Pain Loc --      Pain Edu? --      Excl. in GC? --    No data found.  Updated Vital Signs BP 105/70 (BP Location: Right Arm)    Pulse 82   Temp 99.2 F (37.3 C) (Oral)   Resp 16   Ht 5\' 6"  (1.676 m)   Wt 120 lb (54.4 kg)   LMP 07/11/2017   SpO2 98%   BMI 19.37 kg/m   Visual Acuity Right Eye Distance:   Left Eye Distance:   Bilateral Distance:    Right Eye Near:   Left Eye Near:    Bilateral Near:     Physical Exam  Constitutional: She appears well-developed and well-nourished.  HENT:  Head: Normocephalic.  Right Ear: External ear normal.  Left Ear: External ear normal.  Nose: Nose normal.  Mouth/Throat: Oropharynx is clear and moist.  Eyes: Conjunctivae and EOM are normal. Pupils are equal, round, and reactive to light.  Neck: Normal range of motion.  Cardiovascular: Normal rate and regular rhythm.  Pulmonary/Chest: Effort normal and breath sounds normal.  Abdominal: Soft.  Musculoskeletal: Normal range of motion.  Neurological: She is alert.  Skin: Skin is warm.  Psychiatric: She has a normal mood and affect.  Nursing note and vitals reviewed.    UC Treatments / Results  Labs (all labs ordered are listed, but only abnormal results are displayed) Labs Reviewed  POCT INFLUENZA A/B - Abnormal; Notable for the following components:      Result Value   Influenza A, POC Positive (*)    All other components within normal limits    EKG  EKG Interpretation None       Radiology No results found.  Procedures Procedures (including critical care time)  Medications Ordered in UC Medications - No data to display   Initial Impression / Assessment and Plan / UC Course  I have reviewed the triage vital signs and the nursing notes.  Pertinent labs & imaging results that were available during my care of the patient were reviewed by me and considered in my medical decision making (see chart for details).     Pt counseled on influenza and need for tylenol and encouraged fluids.  Final Clinical Impressions(s) / UC Diagnoses   Final diagnoses:  Influenza    ED Discharge Orders         Ordered    oseltamivir (TAMIFLU) 75 MG capsule  Every 12 hours     07/27/17 1540     An After Visit Summary was printed and given to the patient.  Controlled Substance Prescriptions [place] Controlled Substance Registry consulted? Not Applicable   Elson Areas, [place] 07/28/17 1622

## 2017-08-01 ENCOUNTER — Encounter: Payer: Self-pay | Admitting: Physician Assistant

## 2017-08-01 ENCOUNTER — Ambulatory Visit: Payer: BC Managed Care – PPO | Admitting: Physician Assistant

## 2017-08-01 VITALS — BP 103/73 | HR 61 | Temp 98.3°F | Ht 66.0 in | Wt 120.0 lb

## 2017-08-01 DIAGNOSIS — J111 Influenza due to unidentified influenza virus with other respiratory manifestations: Secondary | ICD-10-CM | POA: Diagnosis not present

## 2017-08-01 DIAGNOSIS — J0100 Acute maxillary sinusitis, unspecified: Secondary | ICD-10-CM

## 2017-08-01 MED ORDER — AZITHROMYCIN 250 MG PO TABS: ORAL_TABLET | ORAL | 0 refills | Status: DC

## 2017-08-01 NOTE — Progress Notes (Signed)
   Subjective:    Patient ID: Helen Perkins, female    DOB: [birth year], 32 y.o.   MRN: [id number]  HPI Pt is a 32 yo female with hx of CAP, colitis, clostridium difficleand recurrent infections who presents to the clinic after being dx with influenza on 07/27/17. She started tamiflu and has one more day left. Her fever and body aches have improved but she is still "miserable". She reports facial pain, headaches, sinus pressure, nasal congestion. She has not been to work all week. No wheezing or CP. She has been SOB with very little exertion. No leg swelling or edema. No ST.   .. Active Ambulatory Problems    Diagnosis Date Noted  . ANXIETY 08/05/2009  . DEPRESSION 08/05/2009  . HEADACHE 08/05/2009  . Recurrent infections 06/14/2014  . Abdominal pain, left lower quadrant 07/05/2014  . CN (constipation) 07/05/2014  . Leukopenia 07/05/2014  . Thrombocytopenia (HCC) 07/05/2014  . History of Clostridium difficile colitis 07/05/2014  . Menorrhagia with regular cycle 08/30/2015  . Memory difficulties 08/30/2015  . No energy 08/30/2015  . 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 Acute pharyngitis 08/05/2009  . DIARRHEA, BLOODY 08/05/2009  . CAP (community acquired pneumonia) 05/31/2014   Past Medical History:  Diagnosis Date  . Allergy   . Clostridium difficile colitis   . Colitis   . Colitis   . Depression   . History of iron deficiency anemia   . History of migraine headaches   . Mononucleosis 2010  . Psoriasis     Review of Systems See HPI.     Objective:   Physical Exam  Constitutional: She is oriented to person, place, and time. She appears well-developed and well-nourished.  HENT:  Head: Normocephalic and atraumatic.  Right Ear: External ear normal.  Left Ear: External ear normal.  Nose: Nose normal.  Mouth/Throat: Oropharynx is clear and moist. No oropharyngeal exudate.  TM's clear bilaterally.  Tenderness over  maxillary sinuses bilaterally.  Nasal turbinates red and swollen.   Eyes: Conjunctivae are normal. Right eye exhibits no discharge. Left eye exhibits no discharge.  Neck: Normal range of motion. Neck supple.  Cardiovascular: Regular rhythm and normal heart sounds.  Pulmonary/Chest: Effort normal and breath sounds normal. She has no wheezes. She exhibits no tenderness.  Lymphadenopathy:    She has no cervical adenopathy.  Neurological: She is alert and oriented to person, place, and time.  Skin: Skin is warm.  Psychiatric: She has a normal mood and affect. Her behavior is normal.          Assessment & Plan:  Marland Kitchen.Marland Kitchen.Diagnoses and all orders for this visit:  Acute non-recurrent maxillary sinusitis -     azithromycin (ZITHROMAX) 250 MG tablet; Take 2 tablets now and then one tablet for 4 days.  Influenza  finish tamiflu. It appears she has a secondary sinus infection as well. Treated with zpak. Hx of clostridium difficle so I would like for her to take as little oral abx as we can. Take with probiotic. Keep resting and stay hydrated. Take ibuprofen and tylenol as needed for headache. Consider flonase for nasal congestion. ressured her that lung exam was normal. Follow up if not improving or if worsening.

## 2017-08-01 NOTE — Patient Instructions (Signed)

## 2017-08-03 ENCOUNTER — Encounter: Payer: Self-pay | Admitting: Physician Assistant

## 2017-08-04 ENCOUNTER — Encounter: Payer: Self-pay | Admitting: Physician Assistant

## 2017-08-13 ENCOUNTER — Encounter: Payer: Self-pay | Admitting: Physician Assistant

## 2017-10-17 LAB — OB RESULTS CONSOLE RPR: RPR: NONREACTIVE

## 2017-10-17 LAB — OB RESULTS CONSOLE HEPATITIS B SURFACE ANTIGEN: Hepatitis B Surface Ag: NEGATIVE

## 2017-10-17 LAB — OB RESULTS CONSOLE GC/CHLAMYDIA
Chlamydia: NEGATIVE
GC PROBE AMP, GENITAL: NEGATIVE

## 2017-10-17 LAB — OB RESULTS CONSOLE HIV ANTIBODY (ROUTINE TESTING): HIV: NONREACTIVE

## 2017-10-17 LAB — OB RESULTS CONSOLE ABO/RH: RH TYPE: POSITIVE

## 2017-10-17 LAB — OB RESULTS CONSOLE RUBELLA ANTIBODY, IGM: Rubella: IMMUNE

## 2017-10-17 LAB — OB RESULTS CONSOLE ANTIBODY SCREEN: ANTIBODY SCREEN: NEGATIVE

## 2017-10-20 ENCOUNTER — Encounter (HOSPITAL_COMMUNITY): Payer: Self-pay

## 2017-11-28 ENCOUNTER — Encounter (HOSPITAL_COMMUNITY): Payer: BC Managed Care – PPO

## 2017-12-09 ENCOUNTER — Other Ambulatory Visit (HOSPITAL_COMMUNITY): Payer: Self-pay | Admitting: Obstetrics and Gynecology

## 2017-12-09 DIAGNOSIS — Z3689 Encounter for other specified antenatal screening: Secondary | ICD-10-CM

## 2017-12-17 ENCOUNTER — Encounter (HOSPITAL_COMMUNITY): Payer: Self-pay

## 2017-12-17 ENCOUNTER — Ambulatory Visit: Payer: BC Managed Care – PPO | Admitting: Cardiovascular Disease

## 2017-12-22 ENCOUNTER — Encounter (HOSPITAL_COMMUNITY): Payer: Self-pay | Admitting: *Deleted

## 2017-12-24 ENCOUNTER — Encounter (HOSPITAL_COMMUNITY): Payer: Self-pay

## 2017-12-24 ENCOUNTER — Ambulatory Visit (HOSPITAL_COMMUNITY)
Admission: RE | Admit: 2017-12-24 | Discharge: 2017-12-24 | Disposition: A | Discharge status: Home / Self Care | Payer: BC Managed Care – PPO | Source: Ambulatory Visit | Attending: Obstetrics and Gynecology | Admitting: Obstetrics and Gynecology

## 2017-12-24 ENCOUNTER — Ambulatory Visit (HOSPITAL_COMMUNITY): Payer: BC Managed Care – PPO

## 2017-12-24 DIAGNOSIS — O4442 Low lying placenta NOS or without hemorrhage, second trimester: Secondary | ICD-10-CM | POA: Diagnosis not present

## 2017-12-24 DIAGNOSIS — Z363 Encounter for antenatal screening for malformations: Secondary | ICD-10-CM

## 2017-12-24 DIAGNOSIS — Z3689 Encounter for other specified antenatal screening: Secondary | ICD-10-CM | POA: Insufficient documentation

## 2017-12-24 DIAGNOSIS — Z3A19 19 weeks gestation of pregnancy: Secondary | ICD-10-CM | POA: Insufficient documentation

## 2017-12-24 HISTORY — DX: Headache: R51

## 2017-12-24 HISTORY — DX: Headache, unspecified: R51.9

## 2017-12-24 NOTE — ED Notes (Signed)
Pt declines genetic counseling.

## 2018-01-09 NOTE — Progress Notes (Signed)
Helen Fusi, MD Reason for referral-possible bicuspid aortic valve  HPI: 32 year old female for evaluation of possible bicuspid aortic valve at request of Rhoderick Moody, MD. Patient is [redacted] weeks pregnant.  Her sister has a bicuspid aortic valve with coarctation.  We are asked to evaluate.  She has some dyspnea on exertion since becoming pregnant.  No orthopnea, PND, pedal edema, exertional chest pain or syncope.  Occasional brief flutters but not sustained.  Current Outpatient Medications  Medication Sig Dispense Refill  . Prenatal Vit-Fe Fumarate-FA (PRENATAL VITAMIN PO) Take by mouth.    . Probiotic Product (PROBIOTIC PO) Take by mouth.     No current facility-administered medications for this visit.     Allergies  Allergen Reactions  . Influenza Vaccines     Allergic to component.   Marland Kitchen Neomycin     REACTION: Rash  . Neosporin [Neomycin-Bacitracin Zn-Polymyx]   . Vancomycin Swelling     Past Medical History:  Diagnosis Date  . Allergy   . Clostridium difficile colitis   . Depression   . Headache   . History of iron deficiency anemia   . History of migraine headaches    History of medication overuse headaches  . Mononucleosis 2010  . Psoriasis    Transient scalp psoriasis as a teenager    Past Surgical History:  Procedure Laterality Date  . BREAST ENHANCEMENT SURGERY  12/2008  . WISDOM TOOTH EXTRACTION      Social History   Socioeconomic History  . Marital status: Married    Spouse name: Not on file  . Number of children: Not on file  . Years of education: Not on file  . Highest education level: Not on file  Occupational History  . Not on file  Social Needs  . Financial resource strain: Not on file  . Food insecurity:    Worry: Not on file    Inability: Not on file  . Transportation needs:    Medical: Not on file    Non-medical: Not on file  Tobacco Use  . Smoking status: Never Smoker  . Smokeless tobacco: Never Used  Substance and  Sexual Activity  . Alcohol use: Not Currently  . Drug use: No  . Sexual activity: Yes  Lifestyle  . Physical activity:    Days per week: Not on file    Minutes per session: Not on file  . Stress: Not on file  Relationships  . Social connections:    Talks on phone: Not on file    Gets together: Not on file    Attends religious service: Not on file    Active member of club or organization: Not on file    Attends meetings of clubs or organizations: Not on file    Relationship status: Not on file  . Intimate partner violence:    Fear of current or ex partner: Not on file    Emotionally abused: Not on file    Physically abused: Not on file    Forced sexual activity: Not on file  Other Topics Concern  . Not on file  Social History Narrative  . Not on file    Family History  Problem Relation Age of Onset  . Depression Mother   . Diabetes Maternal Uncle   . Hyperlipidemia Maternal Uncle   . Diabetes Maternal Grandmother   . Hyperlipidemia Maternal Grandmother   . Hypertension Maternal Grandmother   . Hypertension Maternal Grandfather   . Heart attack Paternal Grandmother   .  Depression Paternal Grandfather   . Heart attack Paternal Grandfather   . Coarctation of the aorta Sister     ROS: no fevers or chills, productive cough, hemoptysis, dysphasia, odynophagia, melena, hematochezia, dysuria, hematuria, rash, seizure activity, orthopnea, PND, pedal edema, claudication. Remaining systems are negative.  Physical Exam:   Blood pressure (!) 102/58, pulse 75, height 5\' 6"  (1.676 m), weight 136 lb 12.8 oz (62.1 kg), last menstrual period 08/08/2017.  General:  Well developed/well nourished in NAD Skin warm/dry Patient not depressed No peripheral clubbing Back-normal HEENT-normal/normal eyelids Neck supple/normal carotid upstroke bilaterally; no bruits; no JVD; no thyromegaly chest - CTA/ normal expansion CV - RRR/normal S1 and S2; no murmurs, rubs or gallops;  PMI  nondisplaced Abdomen -NT/ND, no HSM, + bowel sounds, 22-week intrauterine pregnancy 2+ femoral pulses, no bruits Ext-no edema, chords, 2+ DP Neuro-grossly nonfocal  ECG -normal sinus rhythm at a rate of 75.  RV conduction delay.  Nonspecific ST changes.  Personally reviewed  A/P  1 question bicuspid aortic valve-patient's sister has bicuspid aortic valve with coarctation.  I cannot appreciate murmurs on examination.  She has 2+ radial, carotid, femoral and dorsalis pedis pulses.  Blood pressure in left arm 100/50.  Right arm 90/50.  She has some dyspnea on exertion.  I will arrange an echocardiogram to rule out bicuspid aortic valve and coarctation.  2 22-week intrauterine pregnancy-Per OB.  3 some dyspnea on exertion-not volume overloaded.  Likely related to pregnancy.  Echocardiogram to further assess LV function.  Olga MillersBrian Crenshaw, MD

## 2018-01-13 ENCOUNTER — Ambulatory Visit: Payer: BC Managed Care – PPO | Admitting: Cardiology

## 2018-01-13 ENCOUNTER — Encounter: Payer: Self-pay | Admitting: Cardiology

## 2018-01-13 VITALS — BP 102/58 | HR 75 | Ht 66.0 in | Wt 136.8 lb

## 2018-01-13 DIAGNOSIS — Z8279 Family history of other congenital malformations, deformations and chromosomal abnormalities: Secondary | ICD-10-CM

## 2018-01-13 DIAGNOSIS — R0609 Other forms of dyspnea: Secondary | ICD-10-CM | POA: Diagnosis not present

## 2018-01-13 NOTE — Patient Instructions (Signed)
Medication Instructions:   NO CHANGE  Testing/Procedures:  Your physician has requested that you have an echocardiogram. Echocardiography is a painless test that uses sound waves to create images of your heart. It provides your doctor with information about the size and shape of your heart and how well your heart's chambers and valves are working. This procedure takes approximately one hour. There are no restrictions for this procedure.SCHEDULE IN THE [place] OFFICE   Follow-Up:  Your physician recommends that you schedule a follow-up appointment in: AS NEEDED PENDING TEST RESULTS

## 2018-01-16 ENCOUNTER — Other Ambulatory Visit: Payer: Self-pay

## 2018-01-16 ENCOUNTER — Other Ambulatory Visit (HOSPITAL_COMMUNITY): Payer: BC Managed Care – PPO

## 2018-01-16 ENCOUNTER — Ambulatory Visit (INDEPENDENT_AMBULATORY_CARE_PROVIDER_SITE_OTHER): Payer: BC Managed Care – PPO

## 2018-01-16 DIAGNOSIS — Z8279 Family history of other congenital malformations, deformations and chromosomal abnormalities: Secondary | ICD-10-CM | POA: Diagnosis not present

## 2018-04-23 ENCOUNTER — Encounter (HOSPITAL_COMMUNITY): Payer: Self-pay

## 2018-04-29 ENCOUNTER — Telehealth (HOSPITAL_COMMUNITY): Payer: Self-pay | Admitting: *Deleted

## 2018-04-29 NOTE — Telephone Encounter (Signed)
Preadmission screen  

## 2018-05-04 ENCOUNTER — Other Ambulatory Visit: Payer: Self-pay | Admitting: Obstetrics and Gynecology

## 2018-05-04 ENCOUNTER — Encounter (HOSPITAL_COMMUNITY): Payer: Self-pay

## 2018-05-08 ENCOUNTER — Encounter (HOSPITAL_COMMUNITY)
Admission: RE | Admit: 2018-05-08 | Discharge: 2018-05-08 | Disposition: A | Discharge status: Home / Self Care | Payer: BC Managed Care – PPO | Source: Ambulatory Visit | Attending: Obstetrics and Gynecology | Admitting: Obstetrics and Gynecology

## 2018-05-08 HISTORY — DX: Anemia, unspecified: D64.9

## 2018-05-08 LAB — ABO/RH: ABO/RH(D): B POS

## 2018-05-08 LAB — CBC
HCT: 36.7 % (ref 36.0–46.0)
Hemoglobin: 12.2 g/dL (ref 12.0–15.0)
MCH: 29.8 pg (ref 26.0–34.0)
MCHC: 33.2 g/dL (ref 30.0–36.0)
MCV: 89.5 fL (ref 80.0–100.0)
Platelets: 170 10*3/uL (ref 150–400)
RBC: 4.1 MIL/uL (ref 3.87–5.11)
RDW: 13.6 % (ref 11.5–15.5)
WBC: 10.2 10*3/uL (ref 4.0–10.5)
nRBC: 0 % (ref 0.0–0.2)

## 2018-05-08 LAB — TYPE AND SCREEN
ABO/RH(D): B POS
Antibody Screen: NEGATIVE

## 2018-05-08 NOTE — Patient Instructions (Signed)
Porfirio MylarLindsay M Fuson  05/08/2018   Your procedure is scheduled on:  05/09/2018  Enter through the Main Entrance [facility] at 0600 AM.  Pick up the phone at the desk and dial [phone]  Call this number if you have problems the morning of surgery:[phone]  Remember:   Do not eat food:(After Midnight) Desps de medianoche.  Do not drink clear liquids: (After Midnight) Desps de medianoche.  Take these medicines the morning of surgery with A SIP OF WATER: 0600   Do not wear jewelry, make-up or nail polish.  Do not wear lotions, powders, or perfumes. Do not wear deodorant.  Do not shave 48 hours prior to surgery.  Do not bring valuables to the hospital.  [facility] is not   responsible for any belongings or valuables brought to the hospital.  Contacts, dentures or bridgework may not be worn into surgery.  Leave suitcase in the car. After surgery it may be brought to your room.  For patients admitted to the hospital, checkout time is 11:00 AM the day of              discharge.    N/A   Please read over the following fact sheets that you were given:   Surgical Site Infection Prevention

## 2018-05-08 NOTE — Anesthesia Preprocedure Evaluation (Addendum)
Anesthesia Evaluation  Patient identified by MRN, date of birth, ID band Patient awake    Reviewed: Allergy & Precautions, NPO status , Patient's Chart, lab work & pertinent test results  Airway Mallampati: II  TM Distance: >3 FB Neck ROM: Full    Dental no notable dental hx. (+) Teeth Intact, Dental Advisory Given   Pulmonary    Pulmonary exam normal breath sounds clear to auscultation       Cardiovascular Normal cardiovascular exam Rhythm:Regular Rate:Normal  Echo 01/16/18 Left ventricle: The cavity size was normal. Systolic function was   normal. The estimated ejection fraction was in the range of 60%   to 65%. Wall motion was normal; there were no regional wall   motion abnormalities. Left ventricular diastolic function   parameters were normal. - Aortic valve: Poorly visualized. Grossly appears trileaflet;   normal thickness leaflets. - Aorta: The aorta was normal, not dilated, and non-diseased. - Aortic root: The aortic root was normal in size. - Mitral valve: There was mild regurgitation.    Neuro/Psych  Headaches, Depression negative psych ROS   GI/Hepatic negative GI ROS, Neg liver ROS,   Endo/Other  negative endocrine ROS  Renal/GU negative Renal ROS     Musculoskeletal negative musculoskeletal ROS (+)   Abdominal   Peds  Hematology  (+) Blood dyscrasia, anemia ,   Anesthesia Other Findings   Reproductive/Obstetrics (+) Pregnancy                            Lab Results  Component Value Date   WBC 10.2 05/08/2018   HGB 12.2 05/08/2018   HCT 36.7 05/08/2018   MCV 89.5 05/08/2018   PLT 170 05/08/2018    Anesthesia Physical Anesthesia Plan  ASA: II  Anesthesia Plan: Spinal   Post-op Pain Management:    Induction: Intravenous  PONV Risk Score and Plan: Treatment may vary due to age or medical condition, Ondansetron and Dexamethasone  Airway Management Planned:  Natural Airway  Additional Equipment:   Intra-op Plan:   Post-operative Plan:   Informed Consent: I have reviewed the patients History and Physical, chart, labs and discussed the procedure including the risks, benefits and alternatives for the proposed anesthesia with the patient or authorized representative who has indicated his/her understanding and acceptance.   Dental advisory given  Plan Discussed with:   Anesthesia Plan Comments:         Anesthesia Quick Evaluation

## 2018-05-09 ENCOUNTER — Inpatient Hospital Stay (HOSPITAL_COMMUNITY): Payer: BC Managed Care – PPO | Admitting: Anesthesiology

## 2018-05-09 ENCOUNTER — Inpatient Hospital Stay (HOSPITAL_COMMUNITY)
Admission: AD | Admit: 2018-05-09 | Discharge: 2018-05-12 | DRG: 787 | Disposition: A | Discharge status: Home / Self Care | Payer: BC Managed Care – PPO | Attending: Obstetrics and Gynecology | Admitting: Obstetrics and Gynecology

## 2018-05-09 ENCOUNTER — Encounter (HOSPITAL_COMMUNITY): Payer: Self-pay | Admitting: General Practice

## 2018-05-09 ENCOUNTER — Encounter (HOSPITAL_COMMUNITY)
Admission: AD | Disposition: A | Discharge status: Home / Self Care | Payer: Self-pay | Source: Home / Self Care | Attending: Obstetrics and Gynecology

## 2018-05-09 DIAGNOSIS — O321XX0 Maternal care for breech presentation, not applicable or unspecified: Secondary | ICD-10-CM | POA: Diagnosis present

## 2018-05-09 DIAGNOSIS — D62 Acute posthemorrhagic anemia: Secondary | ICD-10-CM | POA: Diagnosis not present

## 2018-05-09 DIAGNOSIS — O9081 Anemia of the puerperium: Secondary | ICD-10-CM | POA: Diagnosis not present

## 2018-05-09 DIAGNOSIS — O43123 Velamentous insertion of umbilical cord, third trimester: Secondary | ICD-10-CM | POA: Diagnosis present

## 2018-05-09 DIAGNOSIS — O9902 Anemia complicating childbirth: Secondary | ICD-10-CM | POA: Diagnosis not present

## 2018-05-09 DIAGNOSIS — Z3A39 39 weeks gestation of pregnancy: Secondary | ICD-10-CM | POA: Diagnosis not present

## 2018-05-09 DIAGNOSIS — K5900 Constipation, unspecified: Secondary | ICD-10-CM | POA: Diagnosis present

## 2018-05-09 LAB — RPR: RPR Ser Ql: NONREACTIVE

## 2018-05-09 SURGERY — Surgical Case
Anesthesia: Spinal

## 2018-05-09 MED ORDER — LACTATED RINGERS IV SOLN
INTRAVENOUS | Status: DC | PRN
  Administered 2018-05-09: 08:00:00 via INTRAVENOUS

## 2018-05-09 MED ORDER — ZOLPIDEM TARTRATE 5 MG PO TABS: 5.0000 mg | ORAL_TABLET | Freq: Every evening | ORAL | Status: DC | PRN

## 2018-05-09 MED ORDER — FENTANYL CITRATE (PF) 100 MCG/2ML IJ SOLN
INTRAMUSCULAR | Status: AC
  Filled 2018-05-09: qty 2

## 2018-05-09 MED ORDER — NALOXONE HCL 4 MG/10ML IJ SOLN: 1.0000 ug/kg/h | INTRAVENOUS | Status: DC | PRN

## 2018-05-09 MED ORDER — ONDANSETRON HCL 4 MG/2ML IJ SOLN
INTRAMUSCULAR | Status: DC | PRN
  Administered 2018-05-09: 4 mg via INTRAVENOUS

## 2018-05-09 MED ORDER — SIMETHICONE 80 MG PO CHEW
80.0000 mg | CHEWABLE_TABLET | Freq: Three times a day (TID) | ORAL | Status: DC
  Administered 2018-05-09 – 2018-05-12 (×10): 80 mg via ORAL
  Filled 2018-05-09 (×10): qty 1

## 2018-05-09 MED ORDER — HYDROMORPHONE HCL 1 MG/ML IJ SOLN: 0.2500 mg | INTRAMUSCULAR | Status: DC | PRN

## 2018-05-09 MED ORDER — DIPHENHYDRAMINE HCL 50 MG/ML IJ SOLN
INTRAMUSCULAR | Status: AC
  Filled 2018-05-09: qty 1

## 2018-05-09 MED ORDER — METOCLOPRAMIDE HCL 5 MG/ML IJ SOLN
INTRAMUSCULAR | Status: AC
  Filled 2018-05-09: qty 2

## 2018-05-09 MED ORDER — OXYTOCIN 10 UNIT/ML IJ SOLN
INTRAVENOUS | Status: DC | PRN
  Administered 2018-05-09: 40 [IU] via INTRAVENOUS

## 2018-05-09 MED ORDER — ACETAMINOPHEN 500 MG PO TABS
1000.0000 mg | ORAL_TABLET | Freq: Four times a day (QID) | ORAL | Status: DC
  Administered 2018-05-09 – 2018-05-11 (×5): 1000 mg via ORAL
  Filled 2018-05-09 (×7): qty 2

## 2018-05-09 MED ORDER — METOCLOPRAMIDE HCL 5 MG/ML IJ SOLN
INTRAMUSCULAR | Status: DC | PRN
  Administered 2018-05-09: 5 mg via INTRAVENOUS

## 2018-05-09 MED ORDER — SENNOSIDES-DOCUSATE SODIUM 8.6-50 MG PO TABS
2.0000 | ORAL_TABLET | ORAL | Status: DC
  Administered 2018-05-10 – 2018-05-12 (×3): 2 via ORAL
  Filled 2018-05-09 (×3): qty 2

## 2018-05-09 MED ORDER — OXYTOCIN 40 UNITS IN LACTATED RINGERS INFUSION - SIMPLE MED: 2.5000 [IU]/h | INTRAVENOUS | Status: AC

## 2018-05-09 MED ORDER — LACTATED RINGERS IV SOLN
INTRAVENOUS | Status: DC
  Administered 2018-05-09 (×2): via INTRAVENOUS

## 2018-05-09 MED ORDER — SODIUM CHLORIDE 0.9% FLUSH: 3.0000 mL | INTRAVENOUS | Status: DC | PRN

## 2018-05-09 MED ORDER — PRENATAL MULTIVITAMIN CH
1.0000 | ORAL_TABLET | Freq: Every day | ORAL | Status: DC
  Administered 2018-05-09 – 2018-05-12 (×4): 1 via ORAL
  Filled 2018-05-09 (×4): qty 1

## 2018-05-09 MED ORDER — BUPIVACAINE HCL (PF) 0.25 % IJ SOLN
INTRAMUSCULAR | Status: DC | PRN
  Administered 2018-05-09: 8 mL

## 2018-05-09 MED ORDER — PHENYLEPHRINE 8 MG IN D5W 100 ML (0.08MG/ML) PREMIX OPTIME
INJECTION | INTRAVENOUS | Status: DC | PRN
  Administered 2018-05-09: 20 ug/min via INTRAVENOUS

## 2018-05-09 MED ORDER — ACETAMINOPHEN 10 MG/ML IV SOLN: 1000.0000 mg | Freq: Once | INTRAVENOUS | Status: DC | PRN

## 2018-05-09 MED ORDER — NALBUPHINE HCL 10 MG/ML IJ SOLN: 5.0000 mg | INTRAMUSCULAR | Status: DC | PRN

## 2018-05-09 MED ORDER — DIPHENHYDRAMINE HCL 50 MG/ML IJ SOLN: 12.5000 mg | INTRAMUSCULAR | Status: DC | PRN

## 2018-05-09 MED ORDER — BUPIVACAINE IN DEXTROSE 0.75-8.25 % IT SOLN
INTRATHECAL | Status: DC | PRN
  Administered 2018-05-09: 1.6 mL via INTRATHECAL

## 2018-05-09 MED ORDER — SCOPOLAMINE 1 MG/3DAYS TD PT72
MEDICATED_PATCH | TRANSDERMAL | Status: AC
  Filled 2018-05-09: qty 1

## 2018-05-09 MED ORDER — DIPHENHYDRAMINE HCL 25 MG PO CAPS: 25.0000 mg | ORAL_CAPSULE | Freq: Four times a day (QID) | ORAL | Status: DC | PRN

## 2018-05-09 MED ORDER — SIMETHICONE 80 MG PO CHEW: 80.0000 mg | CHEWABLE_TABLET | ORAL | Status: DC | PRN

## 2018-05-09 MED ORDER — DIPHENHYDRAMINE HCL 25 MG PO CAPS: 25.0000 mg | ORAL_CAPSULE | ORAL | Status: DC | PRN

## 2018-05-09 MED ORDER — ONDANSETRON HCL 4 MG/2ML IJ SOLN: 4.0000 mg | Freq: Three times a day (TID) | INTRAMUSCULAR | Status: DC | PRN

## 2018-05-09 MED ORDER — IBUPROFEN 800 MG PO TABS
800.0000 mg | ORAL_TABLET | Freq: Four times a day (QID) | ORAL | Status: DC | PRN
  Administered 2018-05-09 – 2018-05-11 (×6): 800 mg via ORAL
  Filled 2018-05-09 (×7): qty 1

## 2018-05-09 MED ORDER — WITCH HAZEL-GLYCERIN EX PADS: 1.0000 "application " | MEDICATED_PAD | CUTANEOUS | Status: DC | PRN

## 2018-05-09 MED ORDER — LACTATED RINGERS IV SOLN: INTRAVENOUS | Status: DC

## 2018-05-09 MED ORDER — MORPHINE SULFATE (PF) 0.5 MG/ML IJ SOLN
INTRAMUSCULAR | Status: DC | PRN
  Administered 2018-05-09: .15 mg via INTRATHECAL

## 2018-05-09 MED ORDER — KETOROLAC TROMETHAMINE 30 MG/ML IJ SOLN: 30.0000 mg | Freq: Four times a day (QID) | INTRAMUSCULAR | Status: AC | PRN

## 2018-05-09 MED ORDER — SODIUM BICARBONATE 8.4 % IV SOLN
INTRAVENOUS | Status: DC | PRN
  Administered 2018-05-09: 4 mL via EPIDURAL

## 2018-05-09 MED ORDER — SIMETHICONE 80 MG PO CHEW
80.0000 mg | CHEWABLE_TABLET | ORAL | Status: DC
  Administered 2018-05-10 – 2018-05-12 (×3): 80 mg via ORAL
  Filled 2018-05-09 (×3): qty 1

## 2018-05-09 MED ORDER — OXYCODONE-ACETAMINOPHEN 5-325 MG PO TABS
1.0000 | ORAL_TABLET | ORAL | Status: DC | PRN
  Administered 2018-05-10 (×2): 1 via ORAL
  Administered 2018-05-10: 2 via ORAL
  Administered 2018-05-10 (×2): 1 via ORAL
  Administered 2018-05-11: 2 via ORAL
  Administered 2018-05-11 (×2): 1 via ORAL
  Administered 2018-05-12 (×2): 2 via ORAL
  Administered 2018-05-12: 1 via ORAL
  Filled 2018-05-09: qty 1
  Filled 2018-05-09 (×2): qty 2
  Filled 2018-05-09: qty 1
  Filled 2018-05-09 (×3): qty 2
  Filled 2018-05-09 (×4): qty 1

## 2018-05-09 MED ORDER — MEPERIDINE HCL 25 MG/ML IJ SOLN: 6.2500 mg | INTRAMUSCULAR | Status: DC | PRN

## 2018-05-09 MED ORDER — MORPHINE SULFATE (PF) 0.5 MG/ML IJ SOLN
INTRAMUSCULAR | Status: AC
  Filled 2018-05-09: qty 10

## 2018-05-09 MED ORDER — HYDROCODONE-ACETAMINOPHEN 7.5-325 MG PO TABS: 1.0000 | ORAL_TABLET | Freq: Once | ORAL | Status: DC | PRN

## 2018-05-09 MED ORDER — NALOXONE HCL 0.4 MG/ML IJ SOLN: 0.4000 mg | INTRAMUSCULAR | Status: DC | PRN

## 2018-05-09 MED ORDER — PROMETHAZINE HCL 25 MG/ML IJ SOLN: 6.2500 mg | INTRAMUSCULAR | Status: DC | PRN

## 2018-05-09 MED ORDER — DEXAMETHASONE SODIUM PHOSPHATE 4 MG/ML IJ SOLN
INTRAMUSCULAR | Status: DC | PRN
  Administered 2018-05-09: 4 mg via INTRAVENOUS

## 2018-05-09 MED ORDER — DIBUCAINE 1 % RE OINT: 1.0000 "application " | TOPICAL_OINTMENT | RECTAL | Status: DC | PRN

## 2018-05-09 MED ORDER — NALBUPHINE HCL 10 MG/ML IJ SOLN: 5.0000 mg | Freq: Once | INTRAMUSCULAR | Status: DC | PRN

## 2018-05-09 MED ORDER — SCOPOLAMINE 1 MG/3DAYS TD PT72: 1.0000 | MEDICATED_PATCH | Freq: Once | TRANSDERMAL | Status: DC

## 2018-05-09 MED ORDER — KETOROLAC TROMETHAMINE 30 MG/ML IJ SOLN
30.0000 mg | Freq: Four times a day (QID) | INTRAMUSCULAR | Status: AC | PRN
  Administered 2018-05-09: 30 mg via INTRAMUSCULAR

## 2018-05-09 MED ORDER — COCONUT OIL OIL
1.0000 "application " | TOPICAL_OIL | Status: DC | PRN
  Filled 2018-05-09: qty 120

## 2018-05-09 MED ORDER — KETOROLAC TROMETHAMINE 30 MG/ML IJ SOLN
INTRAMUSCULAR | Status: AC
  Filled 2018-05-09: qty 1

## 2018-05-09 MED ORDER — EPHEDRINE 5 MG/ML INJ
INTRAVENOUS | Status: AC
  Filled 2018-05-09: qty 10

## 2018-05-09 MED ORDER — MENTHOL 3 MG MT LOZG: 1.0000 | LOZENGE | OROMUCOSAL | Status: DC | PRN

## 2018-05-09 MED ORDER — PHENYLEPHRINE HCL 10 MG/ML IJ SOLN
INTRAMUSCULAR | Status: DC | PRN
  Administered 2018-05-09: 80 ug via INTRAVENOUS
  Administered 2018-05-09: 120 ug via INTRAVENOUS

## 2018-05-09 MED ORDER — ONDANSETRON HCL 4 MG/2ML IJ SOLN
INTRAMUSCULAR | Status: AC
  Filled 2018-05-09: qty 2

## 2018-05-09 MED ORDER — PHENYLEPHRINE 40 MCG/ML (10ML) SYRINGE FOR IV PUSH (FOR BLOOD PRESSURE SUPPORT)
PREFILLED_SYRINGE | INTRAVENOUS | Status: AC
  Filled 2018-05-09: qty 10

## 2018-05-09 MED ORDER — DEXAMETHASONE SODIUM PHOSPHATE 4 MG/ML IJ SOLN
INTRAMUSCULAR | Status: AC
  Filled 2018-05-09: qty 1

## 2018-05-09 MED ORDER — BUPIVACAINE HCL (PF) 0.25 % IJ SOLN
INTRAMUSCULAR | Status: AC
  Filled 2018-05-09: qty 30

## 2018-05-09 MED ORDER — PHENYLEPHRINE 8 MG IN D5W 100 ML (0.08MG/ML) PREMIX OPTIME
INJECTION | INTRAVENOUS | Status: AC
  Filled 2018-05-09: qty 100

## 2018-05-09 MED ORDER — CEFAZOLIN SODIUM-DEXTROSE 2-4 GM/100ML-% IV SOLN
2.0000 g | INTRAVENOUS | Status: AC
  Administered 2018-05-09: 2 g via INTRAVENOUS

## 2018-05-09 MED ORDER — SCOPOLAMINE 1 MG/3DAYS TD PT72
MEDICATED_PATCH | TRANSDERMAL | Status: DC | PRN
  Administered 2018-05-09: 1 via TRANSDERMAL

## 2018-05-09 MED ORDER — EPHEDRINE SULFATE 50 MG/ML IJ SOLN
INTRAMUSCULAR | Status: DC | PRN
  Administered 2018-05-09 (×2): 5 mg via INTRAVENOUS

## 2018-05-09 MED ORDER — OXYTOCIN 10 UNIT/ML IJ SOLN
INTRAMUSCULAR | Status: AC
  Filled 2018-05-09: qty 4

## 2018-05-09 MED ORDER — FENTANYL CITRATE (PF) 100 MCG/2ML IJ SOLN
INTRAMUSCULAR | Status: DC | PRN
  Administered 2018-05-09: 15 ug via INTRATHECAL

## 2018-05-09 SURGICAL SUPPLY — 46 items
APL SKNCLS STERI-STRIP NONHPOA (GAUZE/BANDAGES/DRESSINGS) ×1
BARRIER ADHS 3X4 INTERCEED (GAUZE/BANDAGES/DRESSINGS) ×3 IMPLANT
BENZOIN TINCTURE PRP APPL 2/3 (GAUZE/BANDAGES/DRESSINGS) ×2 IMPLANT
BRR ADH 4X3 ABS CNTRL BYND (GAUZE/BANDAGES/DRESSINGS) ×1
CHLORAPREP W/TINT 26ML (MISCELLANEOUS) ×3 IMPLANT
CLAMP CORD UMBIL (MISCELLANEOUS) IMPLANT
CLOSURE WOUND 1/2 X4 (GAUZE/BANDAGES/DRESSINGS) ×1
CLOTH BEACON ORANGE TIMEOUT ST (SAFETY) ×3 IMPLANT
DRAPE C SECTION CLR SCREEN (DRAPES) ×3 IMPLANT
DRSG OPSITE POSTOP 4X10 (GAUZE/BANDAGES/DRESSINGS) ×3 IMPLANT
ELECT REM PT RETURN 9FT ADLT (ELECTROSURGICAL) ×3
ELECTRODE REM PT RTRN 9FT ADLT (ELECTROSURGICAL) ×1 IMPLANT
EXTRACTOR VACUUM M CUP 4 TUBE (SUCTIONS) IMPLANT
EXTRACTOR VACUUM M CUP 4' TUBE (SUCTIONS)
GLOVE BIOGEL PI IND STRL 7.0 (GLOVE) ×2 IMPLANT
GLOVE BIOGEL PI INDICATOR 7.0 (GLOVE) ×4
GLOVE ECLIPSE 6.5 STRL STRAW (GLOVE) ×3 IMPLANT
GOWN STRL REUS W/TWL LRG LVL3 (GOWN DISPOSABLE) ×6 IMPLANT
KIT ABG SYR 3ML LUER SLIP (SYRINGE) IMPLANT
NDL HYPO 25X5/8 SAFETYGLIDE (NEEDLE) IMPLANT
NEEDLE HYPO 22GX1.5 SAFETY (NEEDLE) ×3 IMPLANT
NEEDLE HYPO 25X5/8 SAFETYGLIDE (NEEDLE) IMPLANT
NS IRRIG 1000ML POUR BTL (IV SOLUTION) ×3 IMPLANT
PACK C SECTION WH (CUSTOM PROCEDURE TRAY) ×3 IMPLANT
PAD OB MATERNITY 4.3X12.25 (PERSONAL CARE ITEMS) ×3 IMPLANT
RTRCTR C-SECT PINK 25CM LRG (MISCELLANEOUS) IMPLANT
SPONGE LAP 18X18 RF (DISPOSABLE) ×9 IMPLANT
STRIP CLOSURE SKIN 1/2X4 (GAUZE/BANDAGES/DRESSINGS) ×1 IMPLANT
SUT CHROMIC GUT AB #0 18 (SUTURE) IMPLANT
SUT MNCRL 0 VIOLET CTX 36 (SUTURE) ×3 IMPLANT
SUT MON AB 2-0 SH 27 (SUTURE)
SUT MON AB 2-0 SH27 (SUTURE) IMPLANT
SUT MON AB 3-0 SH 27 (SUTURE)
SUT MON AB 3-0 SH27 (SUTURE) IMPLANT
SUT MON AB 4-0 PS1 27 (SUTURE) IMPLANT
SUT MONOCRYL 0 CTX 36 (SUTURE) ×6
SUT PLAIN 2 0 (SUTURE)
SUT PLAIN 2 0 XLH (SUTURE) IMPLANT
SUT PLAIN ABS 2-0 CT1 27XMFL (SUTURE) IMPLANT
SUT VIC AB 0 CT1 36 (SUTURE) ×6 IMPLANT
SUT VIC AB 2-0 CT1 27 (SUTURE) ×3
SUT VIC AB 2-0 CT1 TAPERPNT 27 (SUTURE) ×1 IMPLANT
SUT VIC AB 4-0 PS2 27 (SUTURE) IMPLANT
SYR CONTROL 10ML LL (SYRINGE) ×3 IMPLANT
TOWEL OR 17X24 6PK STRL BLUE (TOWEL DISPOSABLE) ×3 IMPLANT
TRAY FOLEY W/BAG SLVR 14FR LF (SET/KITS/TRAYS/PACK) IMPLANT

## 2018-05-09 NOTE — Transfer of Care (Signed)
Immediate Anesthesia Transfer of Care Note  Patient: Helen Perkins  Procedure(s) Performed: Primary CESAREAN SECTION (N/A )  Patient Location: PACU  Anesthesia Type:Spinal  Level of Consciousness: awake, alert  and oriented  Airway & Oxygen Therapy: Patient Spontanous Breathing  Post-op Assessment: Report given to RN and Post -op Vital signs reviewed and stable  Post vital signs: Reviewed and stable  Last Vitals:  Vitals Value Taken Time  BP    Temp    Pulse 92 05/09/2018  9:14 AM  Resp    SpO2 98 % 05/09/2018  9:14 AM  Vitals shown include unvalidated device data.  Last Pain:  Vitals:   05/09/18 0630  TempSrc: Oral      Patients Stated Pain Goal: 0 (05/09/18 0630)  Complications: No apparent anesthesia complications

## 2018-05-09 NOTE — Brief Op Note (Signed)
05/09/2018  9:09 AM  PATIENT:  Porfirio MylarLindsay M Hemmer  32 y.o. female  PRE-OPERATIVE DIAGNOSIS:  Breech Presentation, term gestation  POST-OPERATIVE DIAGNOSIS: Homero FellersFrank Breech Presentation, term gestation  PROCEDURE:  Primary Cesarean section, kerr hysterotomy  SURGEON:  Surgeon(s) and Role:    * Maxie Betterousins, Cleave Ternes, MD - Primary  PHYSICIAN ASSISTANT:   ASSISTANTS: Arlan Organaniela Paul CNM   ANESTHESIA:   spinal   FINDINGS; live female frank breech presentation, nl tubes and ovaries, no intrauterine anomaly  EBL:  514 mL   BLOOD ADMINISTERED:none  DRAINS: none   LOCAL MEDICATIONS USED:  MARCAINE     SPECIMEN:  Source of Specimen:  placenta  DISPOSITION OF SPECIMEN:  PATHOLOGY  COUNTS:  YES  TOURNIQUET:  * No tourniquets in log *  DICTATION: .Other Dictation: Dictation Number [id number]  PLAN OF CARE: Admit to inpatient   PATIENT DISPOSITION:  PACU - hemodynamically stable.   Delay start of Pharmacological VTE agent (>24hrs) due to surgical blood loss or risk of bleeding: no

## 2018-05-09 NOTE — H&P (Signed)
Helen MylarLindsay M Perkins is a 32 y.o. female presenting for  Primary C/s due to persistent breech presentation. Uncomplicated prenatal course OB History    Gravida  1   Para      Term      Preterm      AB      Living        SAB      TAB      Ectopic      Multiple      Live Births             Past Medical History:  Diagnosis Date  . Allergy   . Anemia   . Clostridium difficile colitis   . Depression   . Headache   . History of iron deficiency anemia   . History of migraine headaches    History of medication overuse headaches  . Mononucleosis 2010  . Psoriasis    Transient scalp psoriasis as a teenager   Past Surgical History:  Procedure Laterality Date  . BREAST ENHANCEMENT SURGERY  12/2008  . WISDOM TOOTH EXTRACTION     Family History: family history includes Coarctation of the aorta in her sister; Depression in her mother and paternal grandfather; Diabetes in her maternal grandmother and maternal uncle; Heart attack in her paternal grandfather and paternal grandmother; Hyperlipidemia in her maternal grandmother and maternal uncle; Hypertension in her father, maternal grandfather, and maternal grandmother. Social History:  reports that she has never smoked. She has never used smokeless tobacco. She reports that she drank alcohol. She reports that she does not use drugs.     Maternal Diabetes: No Genetic Screening: Normal Maternal Ultrasounds/Referrals: Normal Fetal Ultrasounds or other Referrals:  Referred to Materal Fetal Medicine  fhx cardiac defect Maternal Substance Abuse:  No Significant Maternal Medications:  None Significant Maternal Lab Results:  Lab values include: Group B Strep negative Other Comments:  None  Review of Systems  All other systems reviewed and are negative.  History   Blood pressure 111/64, pulse 67, temperature 98.5 F (36.9 C), temperature source Oral, resp. rate 18, height 5' 6.5" (1.689 m), weight 74.7 kg, last menstrual period  08/08/2017, SpO2 98 %. Exam Physical Exam  Constitutional: She is oriented to person, place, and time. She appears well-developed and well-nourished.  HENT:  Head: Atraumatic.  Eyes: EOM are normal.  Neck: Neck supple.  Cardiovascular: Regular rhythm.  GI: Soft.  Musculoskeletal: Normal range of motion.  Neurological: She is alert and oriented to person, place, and time.  Skin: Skin is warm and dry.  Psychiatric: She has a normal mood and affect.    Prenatal labs: ABO, Rh: --/--/B POS, B POS Performed at [facility], [address] [place], [place] [address]  [phone](12/06 1037) Antibody: NEG (12/06 1037) Rubella: Immune (05/17 0000) RPR: Non Reactive (12/06 1037)  HBsAg: Negative (05/17 0000)  HIV: Non-reactive (05/17 0000)  GBS:   negative  Assessment/Plan: Breech presentation Term gestation P) Primary C/S. Risk of surgery includes infection, bleeding, injury to bladder, bowels, ureter, internal scar tissue. Poss need for blood transfusion and its risk( HIV,  Hepatitis, acute rxn. ALL ? Answered.   bedside sono by me confirms breech this am Altheria Shadoan A Oree Mirelez 05/09/2018, 7:42 AM

## 2018-05-09 NOTE — Lactation Note (Signed)
This note was copied from a baby's chart. Lactation Consultation Note  Patient Name: Helen Perkins ZOXWR'UToday's Date: 05/09/2018 Reason for consult: Initial assessment;Term;Primapara;1st time breastfeeding  P1 mother whose infant is now 238 hours old.    Baby was asleep in the bassinet when I arrived.  Encouraged mother to feed 8-12 times/24 hours or sooner if baby shows feeding cues.  Reviewed feeding cues with parents.  Suggested mother do a lot of STS.  Mother is familiar with hand expression and I encouraged this before/after feedings to help increase milk supply.  Colostrum container provided for any EBM she obtains with hand expression.  Mom made aware of O/P services, breastfeeding support groups, community resources, and our phone # for post-discharge questions.  Mother plans to return to work after maternity leave and will be obtaining a DEBP from her insurance company.  Mother had a lot of appropriate questions and has some knowledge about breast feeding.  Encouraged her to call her RN/LC for latch assistance as needed.    Discussed STS, milk coming to volume, how to awaken a sleepy baby, trying to awaken at 3 hour intervals, deep latch for effective breast feeding and how to obtain a deep latch.  Family present and visitors arrived as I was leaving the room.  Mother will call for questions/concerns.     Maternal Data Formula Feeding for Exclusion: No Has patient been taught Hand Expression?: Yes Does the patient have breastfeeding experience prior to this delivery?: No  Feeding Feeding Type: Breast Fed  LATCH Score                   Interventions    Lactation Tools Discussed/Used WIC Program: No   Consult Status Consult Status: Follow-up Date: 05/10/18 Follow-up type: In-patient    Helen Perkins R Helen Perkins 05/09/2018, 5:18 PM

## 2018-05-09 NOTE — Anesthesia Procedure Notes (Signed)
Spinal  Patient location during procedure: OB Start time: 05/09/2018 8:01 AM End time: 05/09/2018 8:05 AM Staffing Anesthesiologist: Trevor IhaHouser, Emmauel Hallums A, MD Performed: anesthesiologist  Preanesthetic Checklist Completed: patient identified, surgical consent, pre-op evaluation, timeout performed, IV checked, risks and benefits discussed and monitors and equipment checked Spinal Block Patient position: sitting Prep: site prepped and draped and DuraPrep Patient monitoring: heart rate, cardiac monitor, continuous pulse ox and blood pressure Approach: midline Location: L3-4 Injection technique: single-shot Needle Needle type: Pencan  Needle gauge: 24 G Needle length: 10 cm Needle insertion depth: 6 cm Assessment Sensory level: T4 Additional Notes  1 Attempt (s). Pt tolerated procedure well.

## 2018-05-09 NOTE — Anesthesia Postprocedure Evaluation (Signed)
Anesthesia Post Note  Patient: Helen Perkins  Procedure(s) Performed: Primary CESAREAN SECTION (N/A )     Patient location during evaluation: Mother Baby Anesthesia Type: Spinal Level of consciousness: oriented and awake and alert Pain management: pain level controlled Vital Signs Assessment: post-procedure vital signs reviewed and stable Respiratory status: spontaneous breathing and respiratory function stable Cardiovascular status: blood pressure returned to baseline and stable Postop Assessment: no headache, no backache, no apparent nausea or vomiting and able to ambulate Anesthetic complications: no    Last Vitals:  Vitals:   05/09/18 1440 05/09/18 1613  BP: 105/69 101/74  Pulse: 73 65  Resp: 16 19  Temp: 36.8 C 37 C  SpO2: 96% 98%    Last Pain:  Vitals:   05/09/18 1613  TempSrc: Oral  PainSc: 2    Pain Goal: Patients Stated Pain Goal: 0 (05/09/18 0630)               Trevor IhaStephen A Abdulrahman Bracey

## 2018-05-10 DIAGNOSIS — O9902 Anemia complicating childbirth: Secondary | ICD-10-CM | POA: Diagnosis not present

## 2018-05-10 LAB — CBC
HCT: 28.9 % — ABNORMAL LOW (ref 36.0–46.0)
Hemoglobin: 9.4 g/dL — ABNORMAL LOW (ref 12.0–15.0)
MCH: 29.2 pg (ref 26.0–34.0)
MCHC: 32.9 g/dL (ref 30.0–36.0)
MCV: 88.9 fL (ref 80.0–100.0)
PLATELETS: 136 10*3/uL — AB (ref 150–400)
RBC: 3.25 MIL/uL — ABNORMAL LOW (ref 3.87–5.11)
RDW: 13.7 % (ref 11.5–15.5)
WBC: 10.7 10*3/uL — ABNORMAL HIGH (ref 4.0–10.5)
nRBC: 0 % (ref 0.0–0.2)

## 2018-05-10 MED ORDER — MAGNESIUM OXIDE 400 (241.3 MG) MG PO TABS
400.0000 mg | ORAL_TABLET | Freq: Every day | ORAL | Status: DC
  Administered 2018-05-10 – 2018-05-12 (×3): 400 mg via ORAL
  Filled 2018-05-10 (×3): qty 1

## 2018-05-10 MED ORDER — POLYSACCHARIDE IRON COMPLEX 150 MG PO CAPS
150.0000 mg | ORAL_CAPSULE | Freq: Every day | ORAL | Status: DC
  Administered 2018-05-10: 150 mg via ORAL
  Filled 2018-05-10: qty 1

## 2018-05-10 NOTE — Op Note (Signed)
[name], Helen M. MEDICAL RECORD [id number] ACCOUNT [id number] DATE OF BIRTH:1986-02-08 FACILITY: [facility] LOCATIO: [id number] PHYSICIAN:Shahiem Bedwell A. Lorean Ekstrand, MD  OPERATIVE REPORT  DATE OF PROCEDURE:  05/09/2018  PREOPERATIVE DIAGNOSIS:  Breech presentation, term gestation.  PROCEDURE:  Primary cesarean section, Kerr hysterotomy.  POSTOPERATIVE DIAGNOSIS: Homero FellersFrank Breech presentation, term gestation.  ANESTHESIA:  Spinal.  SURGEON:  Maxie BetterSheronette Lashe Oliveira, MD  ASSISTANTS:  Arlan Organaniela Paul, CNM  DESCRIPTION OF PROCEDURE:  Under adequate spinal anesthesia, the patient was placed in the supine position with a left lateral tilt.  She was sterilely prepped and draped in the usual fashion.  An indwelling Foley catheter was sterilely placed.  Marcaine  0.25% was injected along the planned Pfannenstiel skin incision site.  Pfannenstiel skin incision was made and carried down to the rectus fascia.  The rectus fascia was opened transversely.  Rectus fascia was then bluntly and sharply dissected off the rectus muscle in a superior and inferior fashion.  The rectus muscle was split in the midline.  The parietal peritoneum was entered bluntly and extended.  A self-retaining Alexis retractor was then placed.  A well-developed lower uterine segment was  noted.  Vesicouterine peritoneum was opened transversely.  The bladder was bluntly dissected off the lower uterine segment and displaced inferiorly.  A curvilinear low transverse uterine incision was then made and extended with bandage scissors.  In doing so, artificial rupture of membranes occurred.  Clear copious amniotic fluid was noted.  Subsequent delivery of a live female from a frank breech position was accomplished using the usual breech maneuvers.  Baby had delayed cord clamping x1 minute  with the cord subsequently clamped and cut.  The baby was transferred to the awaiting pediatricians who assigned Apgars of 9 and 9 at one and five minutes,  respectively.  The placenta was spontaneous, intact, sent to pathology./ Question marginal cord insertion.  Uterine cavity was cleaned of debris.  The intrauterine inspection showed no abnormality.  Uterus was temporarily exteriorized.  Normal tubes and ovaries were noted bilaterally.  Normal uterus was noted.  The uterus was then returned back to  the abdomen.  Incision was then closed in 2 layers.  The first layer with 0 Monocryl running lock stitch.  The second layer was imbricated using 0 Monocryl suture.  Small bleeding along the peritoneal edges was cauterized.  The abdomen was irrigated and suctioned of debris.  Interceed was placed overlying the incision in an inverted T fashion.  The Alexis retractor was then removed.  The parietal peritoneum was then closed with 2-0 Vicryl.  The rectus fascia was closed with 0 Vicryl x2.  The  subcutaneous area was irrigated,  Small bleeders were cauterized.  Interrupted 2-0 plain sutures were placed, and the skin approximated using 4-0 Vicryl subcuticular closure.  Benzoin and Steri-Strips were placed.  SPECIMEN:  Placenta sent to pathology.  ESTIMATED BLOOD LOSS:  575 mL.  URINE OUTPUT:  100 mL clear yellow urine.  INTRAOPERATIVE FLUIDS:  2300 mL  COUNTS:  Sponge and instrument counts x2 was correct.  COMPLICATIONS:  None.  DISPOSITION:  The patient tolerated the procedure well and was transferred to recovery room in stable condition.  LN/NUANCE  D:05/09/2018 T:05/10/2018 JOB:[id number]

## 2018-05-10 NOTE — Lactation Note (Signed)
This note was copied from a baby's chart. Lactation Consultation Note  Patient Name: Helen Perkins UJWJX'BToday's Date: 05/10/2018 Reason for consult: Follow-up assessment;Primapara  P1 mom with infant cluster feeding.  Mom breasts are filling; mom states they are much fuller than prior to delivery.  Infant has recessed chin with tight labial frenulum.  Mom commented infant's top and bottom lip aren't able to flange.  LC observed mom hand express then latch infant. Infant latched and loud swallows were heard spontaneously and with compression and massage.  Mom's pain lessens after a few seconds.  LC attempts to flange lips with is able to flare them slightly which adds to comfort during feed.  Infant sustains latch and excellent jaw movement noted with feed. After feed infant fell asleep, relaxed arms and body noted.  Mom 's nipples are rounded without any compression or pinching.  Mom's nipples are red and moderately tender.  RN had provided coconut oil; mom hasn't used.  LC encouraged hand expression prior to and after feeds.  Collection containers for hand expressing and collecting if feeling full prior to or between feeds.  LC provided comfort gels and mom had instant relief.  LC reviewed cg care and mom knows not to use in conjunction with coconut oil.    Mom had several appropriate questions about engorgement and pumping.  All questions answered, breastfeeding basics reviewed.  Encouraged mom to call out for further assistance and or concerns.         Maternal Data    Feeding Feeding Type: Breast Fed  LATCH Score Latch: Grasps breast easily, tongue down, lips flanged, rhythmical sucking.  Audible Swallowing: Spontaneous and intermittent  Type of Nipple: Everted at rest and after stimulation  Comfort (Breast/Nipple): Filling, red/small blisters or bruises, mild/mod discomfort  Hold (Positioning): Assistance needed to correctly position infant at breast and maintain  latch.  LATCH Score: 8  Interventions Interventions: Breast feeding basics reviewed;Assisted with latch;Skin to skin;Breast massage;Hand express;Breast compression;Adjust position;Comfort gels;Coconut oil;Expressed milk;Position options;Support pillows  Lactation Tools Discussed/Used Tools: Coconut oil;Comfort gels   Consult Status Consult Status: Follow-up Date: 05/11/18 Follow-up type: In-patient    Maryruth HancockKelly Suzanne [facility] 05/10/2018, 11:31 PM

## 2018-05-10 NOTE — Progress Notes (Signed)
Patient ID: Helen Perkins, female   DOB: [birth year], 32 y.o.   MRN: [id number] Subjective: POD# 1 Live born female  Birth Weight: 7 lb 15.2 oz (3605 g) APGAR: 9, 9  Newborn Delivery   Birth date/time:  05/09/2018 08:28:00 Delivery type:  C-Section, Low Transverse Trial of labor:  No C-section categorization:  Primary    Baby name: Helen Perkins Delivering provider: COUSINS, Nena JordanSHERONETTE   Feeding: breast  Pain control at delivery: Spinal   Reports feeling sore, pain at incision site.  Patient reports tolerating PO.   Breast symptoms: working on latch Pain controlled withPO meds Denies HA/SOB/C/P/N/V/dizziness. Flatus present. She reports vaginal bleeding as normal, without clots.  She is ambulating, urinating without difficulty.     Objective:   VS:    Vitals:   05/09/18 2043 05/10/18 0024 05/10/18 0538 05/10/18 0823  BP: (!) 98/59 (!) 84/63 (!) 87/55 (!) 92/59  Pulse: 61 (!) 57 (!) 58 61  Resp: 18 18 18 15   Temp: 98.8 F (37.1 C) 98.2 F (36.8 C) 98.3 F (36.8 C) 98.1 F (36.7 C)  TempSrc: Oral Oral Oral Oral  SpO2: 99% 98% 99%   Weight:      Height:          Intake/Output Summary (Last 24 hours) at 05/10/2018 1000 Last data filed at 05/10/2018 0023 Gross per 24 hour  Intake -  Output 2650 ml  Net -2650 ml        Recent Labs    05/08/18 1037 05/10/18 0535  WBC 10.2 10.7*  HGB 12.2 9.4*  HCT 36.7 28.9*  PLT 170 136*     Blood type: --/--/B POS, B POS Performed at [facility], [address] [place], [place] [address]  (12/06 1037)  Rubella: Immune (05/17 0000)  Vaccines: TDaP UTD         Flu    declined   Physical Exam:  General: alert, cooperative and no distress CV: Regular rate and rhythm Resp: clear Abdomen: soft, nontender, normal bowel sounds Incision: clean, dry and intact Uterine Fundus: firm, below umbilicus, tender / appropriate Lochia: minimal Ext: no edema, redness or tenderness in the calves or  thighs      Assessment/Plan: 32 y.o.   POD# 1. G1P1001                  Principal Problem:   1C/S - Breech 12/7 Active Problems:   Breech presentation   Postpartum care following cesarean delivery   Maternal anemia, with delivery  - started oral Fe and Mag ox  Doing well, stable.               Advance diet as tolerated Encourage rest when baby rests Breastfeeding support Encourage to ambulate Routine post-op care  Neta Mendsaniela C Akya Fiorello, CNM, MSN 05/10/2018, 10:00 AM

## 2018-05-10 NOTE — Addendum Note (Signed)
Addendum  created 05/10/18 [id number] by Elgie CongoMalinova, Kseniya Grunden H, CRNA   Sign clinical note

## 2018-05-10 NOTE — Anesthesia Postprocedure Evaluation (Signed)
Anesthesia Post Note  Patient: Helen Perkins  Procedure(s) Performed: Primary CESAREAN SECTION (N/A )     Patient location during evaluation: Mother Baby Anesthesia Type: Spinal Level of consciousness: awake and alert Pain management: pain level controlled Vital Signs Assessment: post-procedure vital signs reviewed and stable Respiratory status: spontaneous breathing, nonlabored ventilation and respiratory function stable Cardiovascular status: stable Postop Assessment: no headache, no backache, spinal receding, able to ambulate, adequate PO intake, no apparent nausea or vomiting and patient able to bend at knees Anesthetic complications: no    Last Vitals:  Vitals:   05/10/18 0024 05/10/18 0538  BP: (!) 84/63 (!) 87/55  Pulse: (!) 57 (!) 58  Resp: 18 18  Temp: 36.8 C 36.8 C  SpO2: 98% 99%    Last Pain:  Vitals:   05/10/18 0538  TempSrc: Oral  PainSc:    Pain Goal: Patients Stated Pain Goal: 0 (05/09/18 0630)               Donnalee CurryMalinova,Jeronda Don Hristova

## 2018-05-11 MED ORDER — POLYSACCHARIDE IRON COMPLEX 150 MG PO CAPS
150.0000 mg | ORAL_CAPSULE | ORAL | Status: DC
  Filled 2018-05-11: qty 1

## 2018-05-11 MED ORDER — DOCUSATE SODIUM 100 MG PO CAPS
100.0000 mg | ORAL_CAPSULE | Freq: Two times a day (BID) | ORAL | Status: DC
  Administered 2018-05-11 – 2018-05-12 (×3): 100 mg via ORAL
  Filled 2018-05-11 (×3): qty 1

## 2018-05-11 MED ORDER — IBUPROFEN 800 MG PO TABS
800.0000 mg | ORAL_TABLET | Freq: Three times a day (TID) | ORAL | Status: DC | PRN
  Administered 2018-05-11 – 2018-05-12 (×2): 800 mg via ORAL
  Filled 2018-05-11 (×2): qty 1

## 2018-05-11 MED ORDER — BISACODYL 10 MG RE SUPP: 10.0000 mg | Freq: Every day | RECTAL | Status: DC | PRN

## 2018-05-11 NOTE — Lactation Note (Signed)
This note was copied from a baby's chart. Lactation Consultation Note  Patient Name: Helen Perkins WGNFA'OToday's Date: 05/11/2018 Reason for consult: Follow-up assessment;Term;Difficult latch;1st time breastfeeding;Primapara  P1 mother whose infant is now 2054 hours old.    Mother tearful as I arrived.  She has family and father of baby at bedside for support.  Mother is tired, in pain and saddened because she cannot get baby to latch and feed.  Offered to assist and mother accepted.  Mother's breasts are filling and nipples are erect.  There are small reddened and bruised areas to both nipples. Mother has coconut oil and comfort gels at bedside.   Positioned mother appropriately and latched to the right breast in the football hold.  Baby latched but became irritable and did not want to suck.  Tried to burp without success.  On my gloved finger, baby had a tight suck with some tongue thrusting and tends to keep her tongue to the upper palate.  Performed suck training to help relax tongue and jaw.  Baby has a small mouth and recessed chin but can extend tongue over lower gum line when relaxed.  At the breast baby was able to flange upper lip.  Attempted a second time in the same hold with no success.  Suggested mother try the cross cradle and baby was able to latch, but, again would not suck.  Recommended mother begin pumping with the DEBP and supplement baby.  Mother agreeable.  Initiated the DEBP and reviewed set up, assembly, disassembly and cleaning of pump parts.  Mother pumped for 15 minutes and was able to express 6 mls of EBM which was fed back by curved tip syringe.  Demonstrated this for parents and educated on feeding supplementation volumes.  Encouraged mother to feed 8-12 times/24 hours or sooner if baby shows cues.  She will continue hand expression and post pumping for 15 minutes feeding back volume to baby.  Mother will call for latch assistance as needed. Feeding plan written on board in  her room since she is slightly overwhelmed right now.  Praised her efforts and encouraged her.  She is planning to rest now.  RN updated.    Maternal Data Formula Feeding for Exclusion: No Has patient been taught Hand Expression?: Yes Does the patient have breastfeeding experience prior to this delivery?: No  Feeding Feeding Type: Breast Fed  LATCH Score Latch: Too sleepy or reluctant, no latch achieved, no sucking elicited.  Audible Swallowing: None  Type of Nipple: Everted at rest and after stimulation  Comfort (Breast/Nipple): Filling, red/small blisters or bruises, mild/mod discomfort  Hold (Positioning): Assistance needed to correctly position infant at breast and maintain latch.  LATCH Score: 4  Interventions Interventions: Breast feeding basics reviewed;Assisted with latch;Skin to skin;Breast massage;Hand express;Breast compression;Adjust position;DEBP;Comfort gels;Coconut oil;Expressed milk;Position options;Support pillows  Lactation Tools Discussed/Used Tools: Pump;Coconut oil;Comfort gels WIC Program: No Pump Review: Setup, frequency, and cleaning;Milk Storage Initiated by:: Maurizio Geno Date initiated:: 05/04/18   Consult Status Consult Status: Follow-up Date: 05/12/18 Follow-up type: In-patient    Cristi Gwynn R Ally Knodel 05/11/2018, 3:47 PM

## 2018-05-11 NOTE — Progress Notes (Signed)
POSTOPERATIVE DAY # 2 S/P CS - breech  S:         Reports feeling poorly - pain and burning at incision / hx chronic constipation             Tolerating po intake / no nausea / no vomiting / + flatus / no BM             Bleeding is light             Pain controlled with Motrin and oxycodone             Up ad lib / ambulatory/ voiding QS  Newborn Breast   O:  VS: BP 100/67 (BP Location: Right Arm)   Pulse 70   Temp 98.4 F (36.9 C) (Oral)   Resp 18   Ht 5' 6.5" (1.689 m)   Wt 74.7 kg   LMP 08/08/2017   SpO2 99%   Breastfeeding? Unknown   BMI 26.19 kg/m   LABS:              Recent Labs    05/08/18 1037 05/10/18 0535  WBC 10.2 10.7*  HGB 12.2 9.4*  PLT 170 136*               Bloodtype: B pos  Rubella: Immune (05/17 0000)              tdap current 2019 / flu declined                                 Physical Exam:             Alert and Oriented X3  Lungs: Clear and unlabored  Heart: regular rate and rhythm / no mumurs  Abdomen: soft, non-tender, moderately distended              Fundus: firm, non-tender, Ueven             Dressing intact honeycomb              Incision:  approximated with suture / no erythema / no ecchymosis / no drainage  Perineum: intact  Lochia: light  Extremities: no edema, no calf pain or tenderness, SCD in place  A:        POD # 2 S/P CS - breech            Constipation - chronic history / pain management  P:        Routine postoperative care              Adjust analgesia - recommend daily magnesium and colace - miralax if no BM in 3 days             Warm fluids, shower, ambulation to increase bowel motility  Helen Perkins CNM, MSN, [facility] 05/11/2018, 8:41 AM

## 2018-05-12 MED ORDER — OXYCODONE-ACETAMINOPHEN 5-325 MG PO TABS: 1.0000 | ORAL_TABLET | ORAL | 0 refills | Status: DC | PRN

## 2018-05-12 MED ORDER — IBUPROFEN 800 MG PO TABS: 800.0000 mg | ORAL_TABLET | Freq: Three times a day (TID) | ORAL | 0 refills | Status: DC

## 2018-05-12 NOTE — Discharge Summary (Addendum)
Obstetric Discharge Summary   Patient Name: Helen Perkins DOB: 1986-04-03 MRN: [id number]  Date of Admission: 05/09/2018 Date of Discharge: 05/12/2018 Date of Delivery: 05/09/18 Gestational Age at Delivery: 4121w1d  [facility] - Dr. Amado NashAlmquist, now Dr. Cherly Hensenousins  Antepartum complications:  - Breech - Depression - pt's sister with coarctation of heart and bicuspid valve - s/p MFM and genetics consult - Low-lying placenta resolved - Chronic constipation - ?Marginal cord insertion  Prenatal Labs:  ABO, Rh: --/--/B POS, B POS Performed at [facility], [address] [place], [place] [address]  [phone](12/06 1037) Antibody: NEG (12/06 1037) Rubella: Immune (05/17 0000) RPR: Non Reactive (12/06 1037)  HBsAg: Negative (05/17 0000)  HIV: Non-reactive (05/17 0000)  GBS:   negative Admitting Diagnosis: Primary LTCS at 39+1 weeks for breech  Secondary Diagnoses: Patient Active Problem List   Diagnosis Date Noted  . Maternal anemia, with delivery 05/10/2018  . Breech presentation 05/09/2018  . Postpartum care following cesarean delivery (12/7) 05/09/2018  . 1C/S - Breech 12/7 05/09/2018  . 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 Menorrhagia with regular cycle 08/30/2015  . Memory difficulties 08/30/2015  . No energy 08/30/2015  . Abdominal pain, left lower quadrant 07/05/2014  . CN (constipation) 07/05/2014  . Leukopenia 07/05/2014  . Thrombocytopenia (HCC) 07/05/2014  . History of Clostridium difficile colitis 07/05/2014  . Recurrent infections 06/14/2014  . ANXIETY 08/05/2009  . DEPRESSION 08/05/2009  . HEADACHE 08/05/2009    Date of Delivery: 05/09/18 Delivered By: Dr. Cherly Hensenousins/ D. Renae FicklePaul CNM assist Delivery Type: primary cesarean section, low transverse incision  Newborn Data: Live born female  Birth Weight: 7 lb 15.2 oz (3605 g) APGAR: 9, 9  Newborn Delivery   Birth date/time:  05/09/2018 08:28:00 Delivery type:  C-Section, Low Transverse Trial of labor:   No C-section categorization:  Primary      Hospital/Postpartum Course  (Cesarean Section):  Pt. Admitted for primary c/s for breech presentation. See notes and delivery summary for details. Patient had an uncomplicated postpartum course.  By time of discharge on POD#3, her pain was controlled on oral pain medications; she had appropriate lochia and was ambulating, voiding without difficulty, tolerating regular diet and passing flatus.   She was deemed stable for discharge to home.     Labs: CBC Latest Ref Rng & Units 05/10/2018 05/08/2018 08/30/2015  WBC 4.0 - 10.5 K/uL 10.7(H) 10.2 5.5  Hemoglobin 12.0 - 15.0 g/dL 9.5(M9.4(L) 84.112.2 32.413.0  Hematocrit 36.0 - 46.0 % 28.9(L) 36.7 39.4  Platelets 150 - 400 K/uL 136(L) 170 264   Conflict (See Lab Report): B POS/B POS Performed at [facility], [address] [place], [place] [address]   Physical exam:  BP 94/66   Pulse (!) 57   Temp 97.6 F (36.4 C) (Axillary)   Resp 17   Ht 5' 6.5" (1.689 m)   Wt 74.7 kg   LMP 08/08/2017   SpO2 98%   Breastfeeding? Unknown   BMI 26.19 kg/m  General: alert and no distress Pulm: normal respiratory effort Lochia: appropriate Abdomen: soft, NT Uterine Fundus: firm, below umbilicus Perineum: healing well, no significant erythema, no significant edema Incision: c/d/i, healing well, no significant drainage, no dehiscence, no significant erythema mild ecchymosis below incision on mons pubis / no drainage Extremities: No evidence of DVT seen on physical exam. +1 BLE lower extremity edema.   Disposition: stable, discharge to home Baby Feeding: breast milk Baby Disposition: home with mom  Contraception: not discussed   Rh Immune  globulin given: N/A Rubella vaccine given: N/A Tdap vaccine given in AP or PP setting: UTD 2019 Flu vaccine given in AP or PP setting: declined due to allergy   Plan:  Helen Perkins was discharged to home in good condition. Follow-up appointment at [facility] in  6 weeks.  Discharge Instructions: Per After Visit Summary. Refer to After Visit Summary and [facility] discharge booklet  Activity: Advance as tolerated. Pelvic rest for 6 weeks.   Diet: Regular, Heart Healthy Discharge Medications: Will send in [facility] All purpose nipple cream from [employer] to [facility]  Allergies as of 05/12/2018      Reactions   Influenza Vaccines Other (See Comments)   Allergic to component Unsure of reaction type   Vancomycin Swelling   Neomycin Rash   Neosporin [neomycin-bacitracin Zn-polymyx] Rash      Medication List    TAKE these medications   docusate sodium 100 MG capsule Commonly known as:  COLACE Take 100 mg by mouth 2 (two) times daily.   ibuprofen 800 MG tablet Commonly known as:  ADVIL,MOTRIN Take 1 tablet (800 mg total) by mouth every 8 (eight) hours.   oxyCODONE-acetaminophen 5-325 MG tablet Commonly known as:  PERCOCET/ROXICET Take 1-2 tablets by mouth every 4 (four) hours as needed for moderate pain.   prenatal multivitamin Tabs tablet Take 1 tablet by mouth every evening.   PROBIOTIC PO Take 1 capsule by mouth daily as needed (digestive health.).      Outpatient follow up:  Follow-up Information    Maxie Better, MD. Schedule an appointment as soon as possible for a visit in 6 week(s).   Specialty:  Obstetrics and Gynecology Why:  Postpartum visit Contact information: [address] [place] [address] [phone]           Signed:  Carlean Jews, MSN, CNM [facility]

## 2018-05-12 NOTE — Lactation Note (Signed)
This note was copied from a baby's chart. Lactation Consultation Note: Infant is 9% weight loss. Infant is 1873 hours old. Observed infant for 25 min feeding . Infant suckling and swallowing with good observed milk transfer.  Mothers breast very full and firm. Assist with hand pumping and reverse pressure.   Infant latched to alternate breast in cross cradle hold. Infant sustained latch for 25 mins.  Mothers breast softened after feeding.  Mother has bilateral cracks on nipples.  Advised to get OB to [facility].  Mother has comfort gels .  Lots of teaching on proper positioning and latching infant.   Advised mother to follow up for pre and post weight assessment.  Reviewed good breast massage, treatment and prevention of engorgement.  Encouraged mother to [facility] for questions and or concerns about breastfeeding.   Patient Name: Helen Perkins Reason for consult: Follow-up assessment   Maternal Data    Feeding Feeding Type: Breast Fed  LATCH Score Latch: Grasps breast easily, tongue down, lips flanged, rhythmical sucking.  Audible Swallowing: Spontaneous and intermittent  Type of Nipple: Everted at rest and after stimulation  Comfort (Breast/Nipple): Engorged, cracked, bleeding, large blisters, severe discomfort  Hold (Positioning): Assistance needed to correctly position infant at breast and maintain latch.  LATCH Score: 7  Interventions Interventions: Assisted with latch;Skin to skin;Breast massage;Hand express;Pre-pump if needed;Reverse pressure;Breast compression;Adjust position;Support pillows;Position options;Expressed milk;Comfort gels;Hand pump;DEBP  Lactation Tools Discussed/Used Pump Review: Setup, frequency, and cleaning Initiated by:: rn Date initiated:: 05/12/18   Consult Status Consult Status: Complete    Michel BickersKendrick, Annisha Baar [name], 12:17 PM

## 2018-05-12 NOTE — Progress Notes (Addendum)
Mom Cx of "heat" in breast. RN assessed and patient has some small knots. RN educated on engorgement and instructed mom to pump until pressure is relieved, breast massage and place ice on them as needed. RN encouraged coconut oil for crack/red nipples. patient asked to be left alone until meds can be given. RN instructed them to call out if needed.  Paulina Muchmore L Bobi Daudelin, RN

## 2018-05-12 NOTE — Progress Notes (Addendum)
POSTOPERATIVE DAY # 3 S/P Primary LTCS for breech, baby girl "Helen Perkins"   S:         Reports feeling sore, tired; having some difficulty with breastfeeding; reports sore nipples. Tearful about breastfeeding and overwhelmed              Tolerating po intake / no nausea / no vomiting / + flatus / small hard BM x1 - hx of chronic constipation. Wants to avoid oral iron until first bowel movement   Denies dizziness, SOB, or CP             Bleeding is moderate             Pain controlled with Motrin and Percocet             Up ad lib / ambulatory/ voiding QS  Newborn breast feeding - reports engorgement; has breast implants, so states she is having difficulty relieving clogged ducts. Sore nipples using coconut oil and comfort gels.    O:  VS: BP 94/66   Pulse (!) 57   Temp 97.6 F (36.4 C) (Axillary)   Resp 17   Ht 5' 6.5" (1.689 m)   Wt 74.7 kg   LMP 08/08/2017   SpO2 98%   Breastfeeding? Unknown   BMI 26.19 kg/m    LABS:               Recent Labs    05/10/18 0535  WBC 10.7*  HGB 9.4*  PLT 136*               Bloodtype: --/--/B POS, B POS Performed at [facility], [address] [place], [place] [address]  (12/06 1037)  Rubella: Immune (05/17 0000)                                             Tdap UTD  Declined flu             Physical Exam:             Alert and Oriented X3  Breast: nipples are cracked, scabbed, red bilaterally   Lungs: Clear and unlabored  Heart: regular rate and rhythm / no murmurs  Abdomen: soft, non-tender, non-distended, active bowel sounds present              Fundus: firm, non-tender, U-3             Dressing: honeycomb with steri-strips c/d/i              Incision:  approximated with sutures / no erythema / mild ecchymosis below incision on mons pubis / no drainage  Perineum: intact  Lochia: small, no clots   Extremities: +1 BLE edema, no calf pain or tenderness  A:        POD # 3 S/P Primary LTCS            Chronic  Constipation  ABL Anemia - on oral FE  Dependent lower extremity edema   P:        Routine postoperative care              Breastfeeding techniques discussed and nipple soreness interventions discussed   Encouraged outpatient lactation   Okay to continue Colace 100mg  BID and Magnesium oxide 400mg  daily; Miralax daily PRN if no BM in 2 days   Resume oral FE  daily after first bowel movement   Encouraged to increase hydration   Discharge home today  WOB discharge book given, instructions and warning s/s reviewed   Per lactation, recommend [employer] All Purpose nipple cream - unable to send to pharmacy from the hospital; will plan to send from [employer]. Pt. And nurse notified   F/u with Dr. Amado NashAlmquist in 6 weeks     Carlean JewsMeredith Jaaziel Peatross, MSN, [facility]

## 2018-06-25 IMAGING — US US TRANSVAGINAL NON-OB
1 series · 14 of 25 positions shown · non-contrast
Comparison: 07/04/2014.

CLINICAL DATA: Left lower quadrant pain with nausea.

EXAM:
TRANSABDOMINAL AND TRANSVAGINAL ULTRASOUND OF PELVIS
TECHNIQUE: Both transabdominal and transvaginal ultrasound examinations of the
pelvis were performed. Transabdominal technique was performed for
global imaging of the pelvis including uterus, ovaries, adnexal
regions, and pelvic cul-de-sac. It was necessary to proceed with
endovaginal exam following the transabdominal exam to visualize the
uterus, endometrium, ovaries and adnexal regions.

[Series 1: us transvaginal non-ob · 0.16mm/px · 14 of 93 slices shown]
[im 1/93]
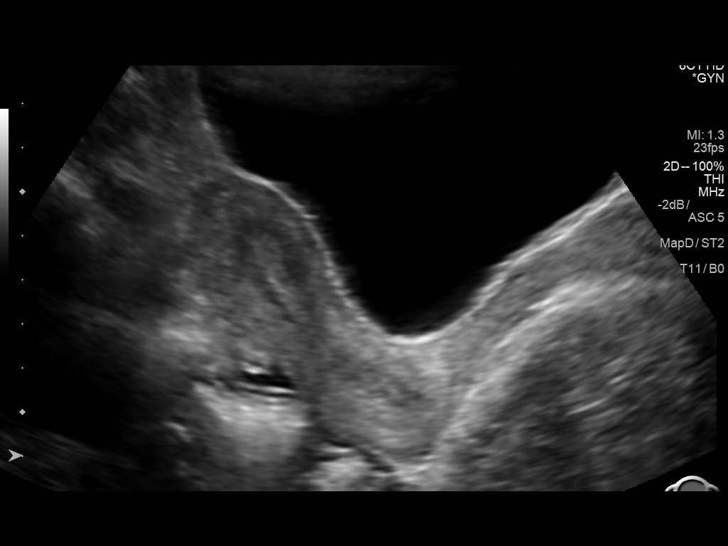
[im 8/93]
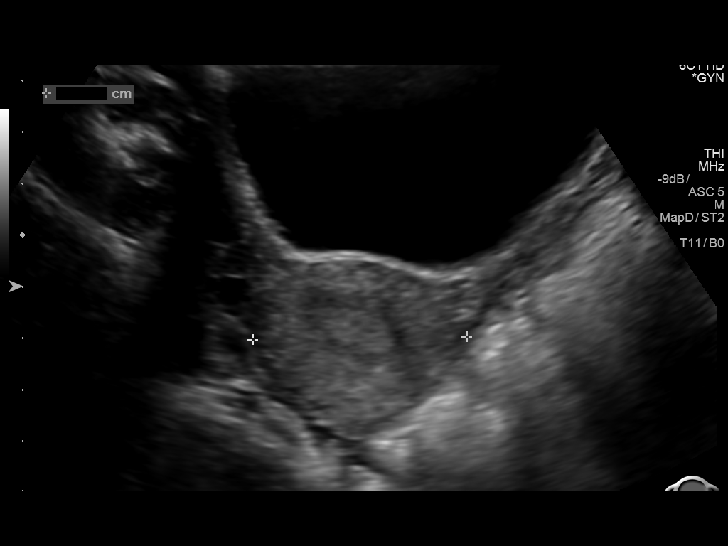
[im 16/93]
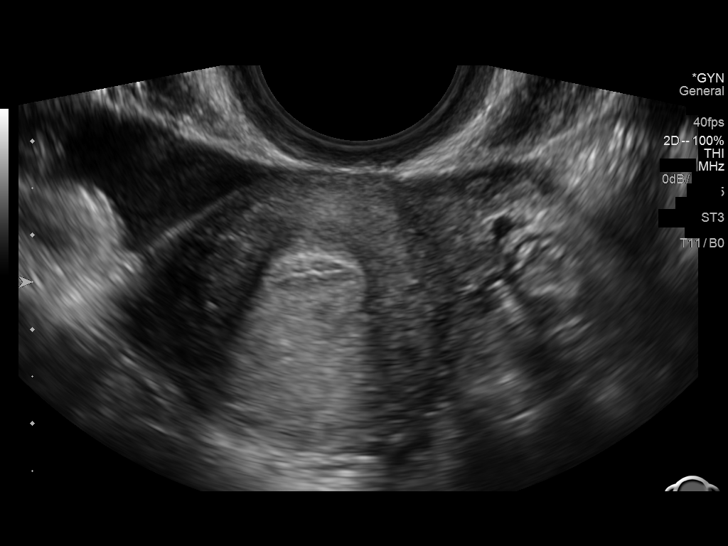
[im 24/93]
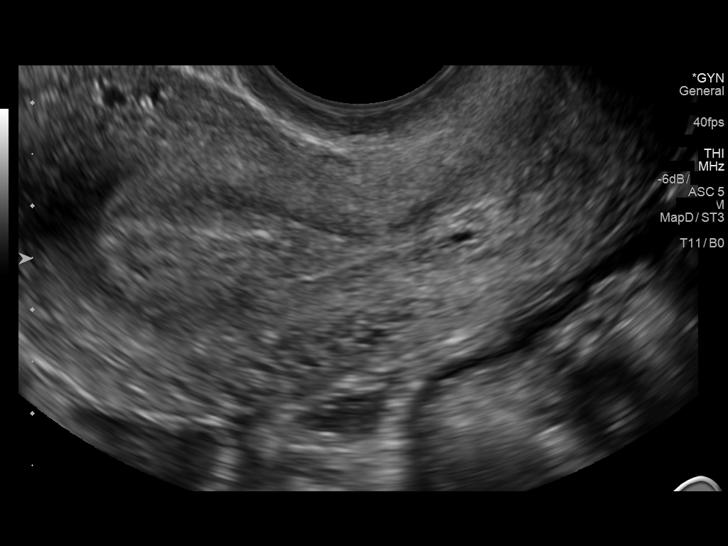
[im 31/93]
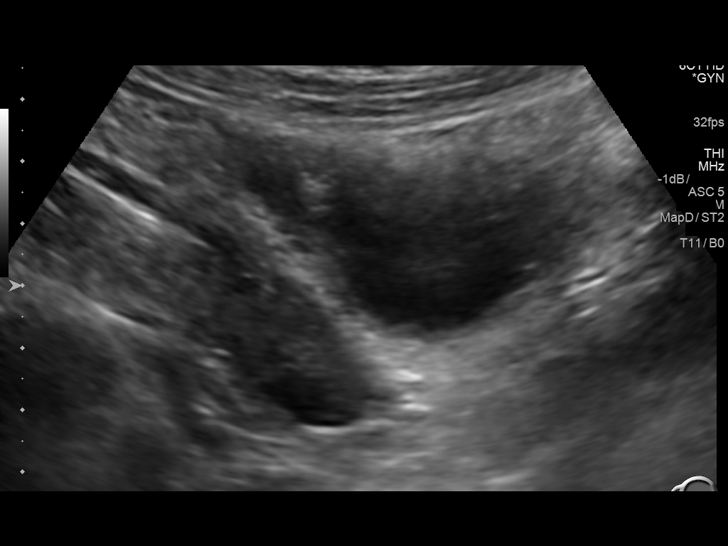
[im 35/93]
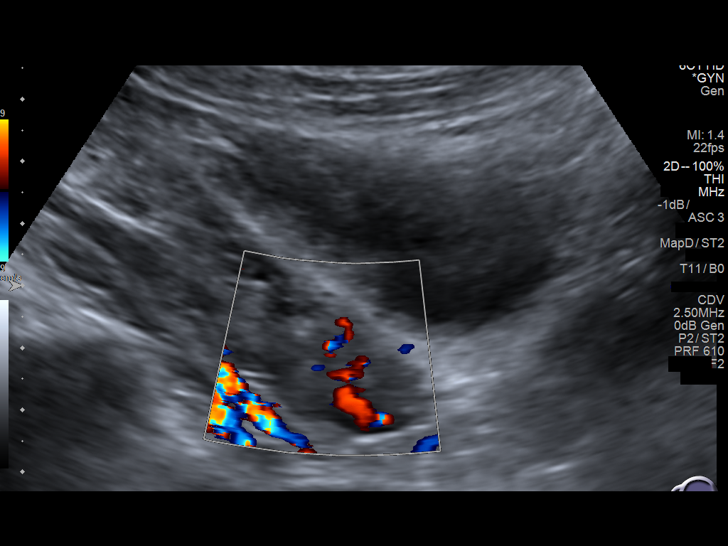
[im 43/93]
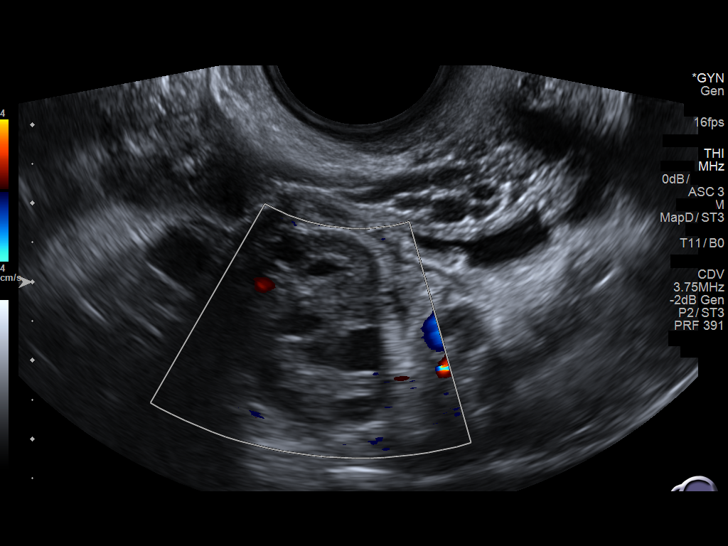
[im 50/93]
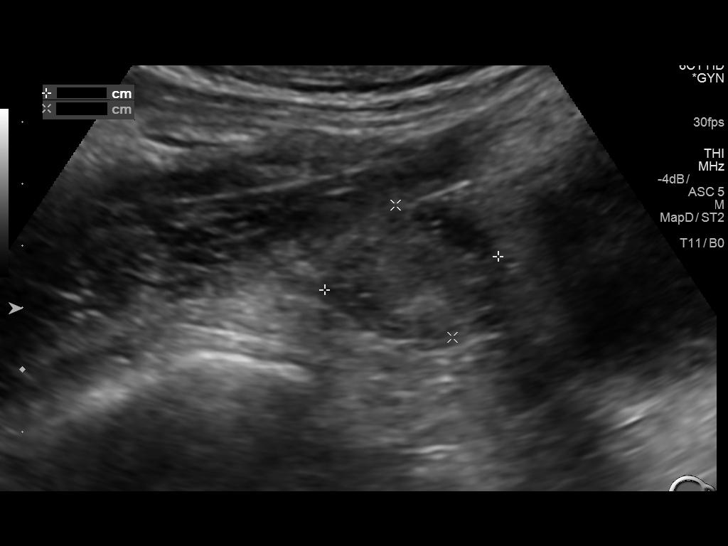
[im 58/93]
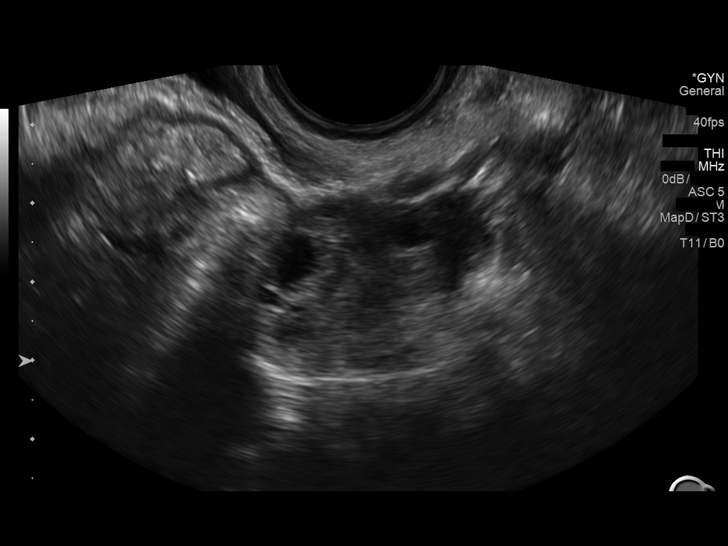
[im 62/93]
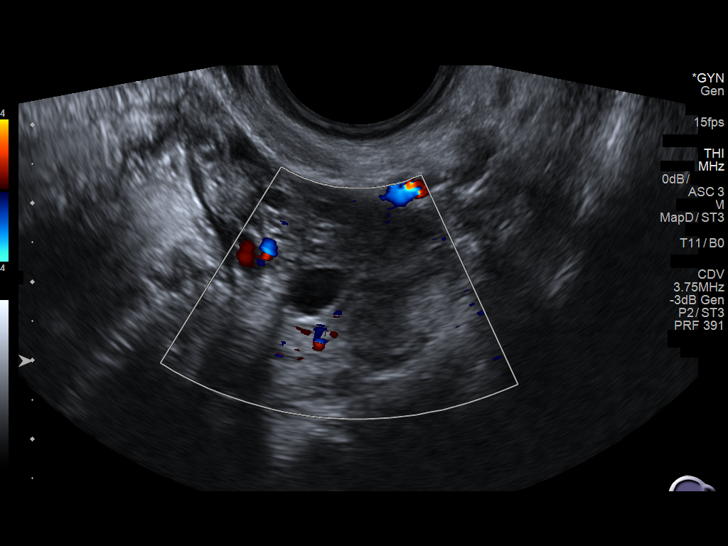
[im 70/93]
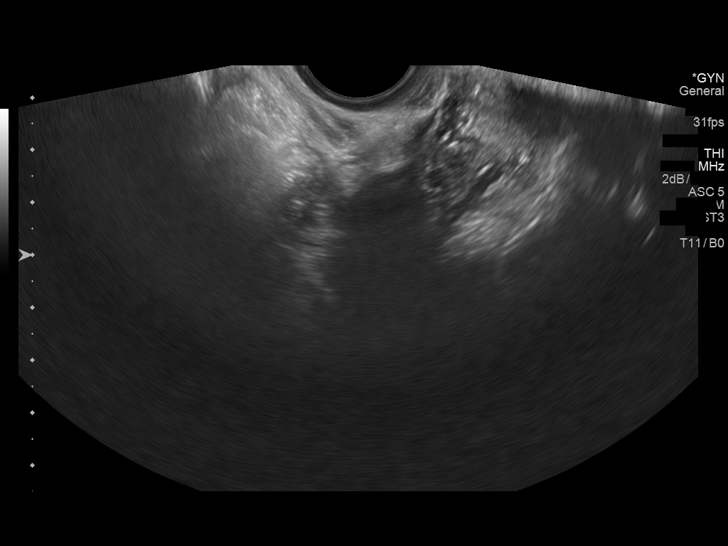
[im 77/93]
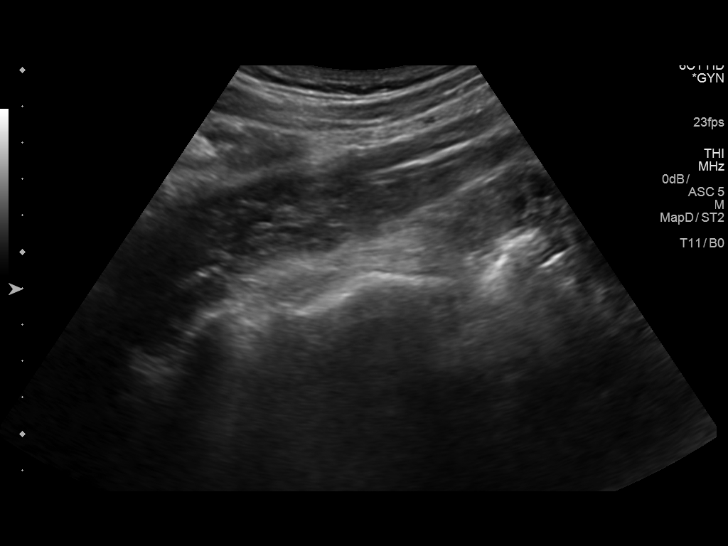
[im 85/93]
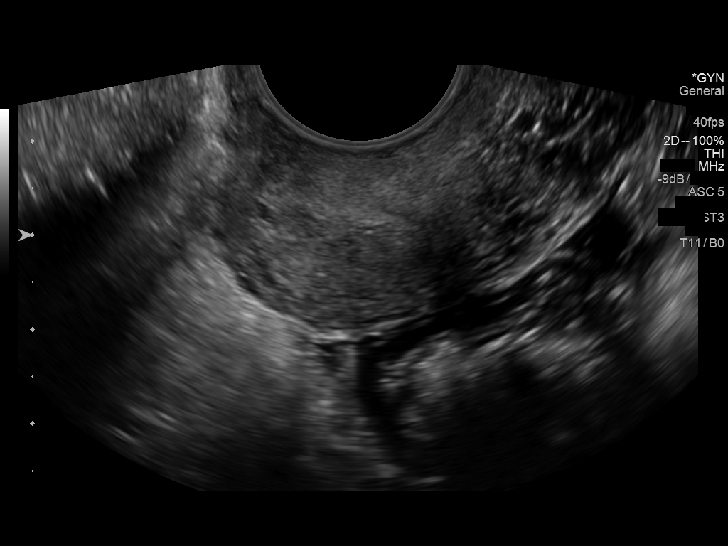
[im 93/93]
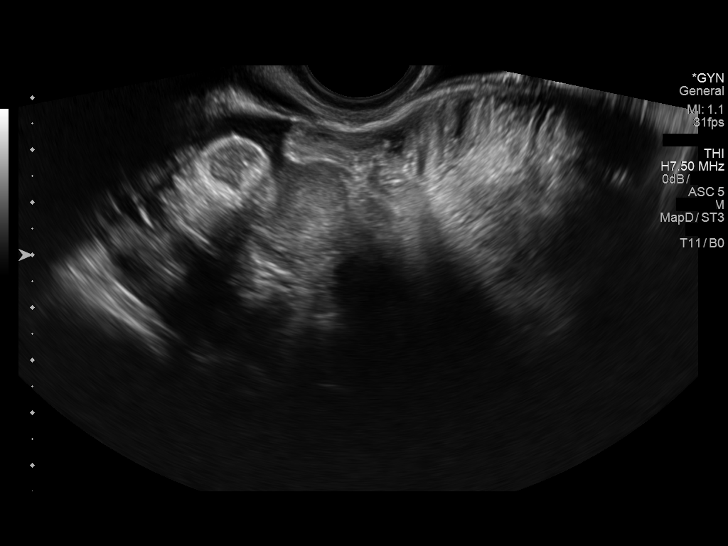

[14 of 25 positions shown; findings below may reference images not displayed]

FINDINGS: Uterus

Measurements: 7.3 x 3.5 x 3.4 cm. No fibroids or other mass
visualized.

Endometrium

Thickness: 9 mm.  No focal abnormality visualized.

Right ovary

Measurements: 3.0 x 1.7 x 1.6 cm. Normal appearance/no adnexal mass.

Left ovary

Measurements: 2.8 x 2.5 x 1.8 cm. Normal appearance/no adnexal mass.

Other findings

Small free fluid.
IMPRESSION: Small free fluid.

## 2018-08-27 IMAGING — US US MFM OB DETAIL+14 WK
1 series · 13 of 28 positions shown · non-contrast
Comparison: none

[Series 1: us mfm ob detail+14 wk · 13 of 104 slices shown]
[im 4/104]
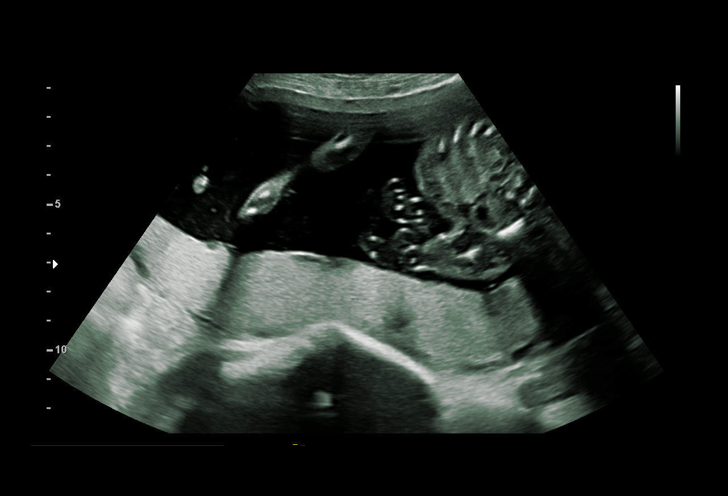
[im 12/104]
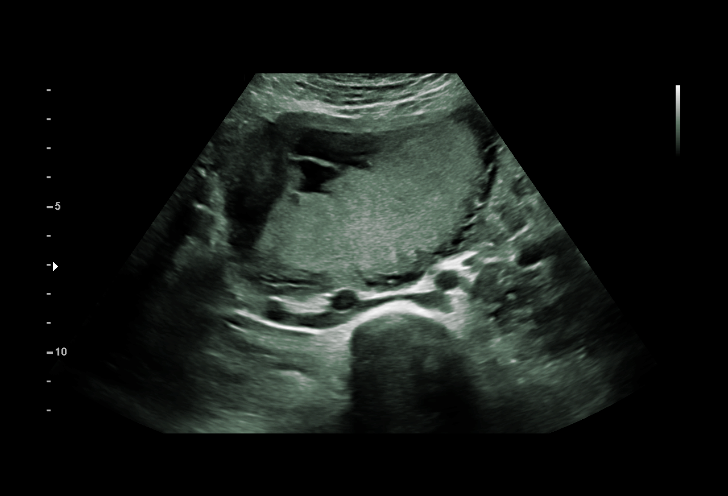
[im 20/104]
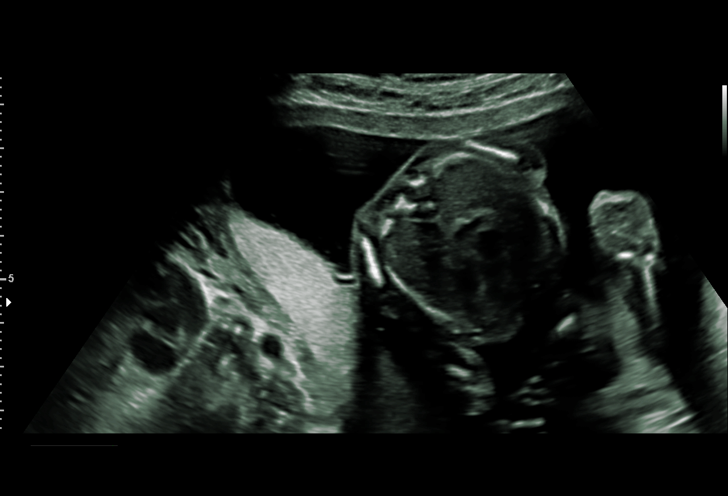
[im 27/104]
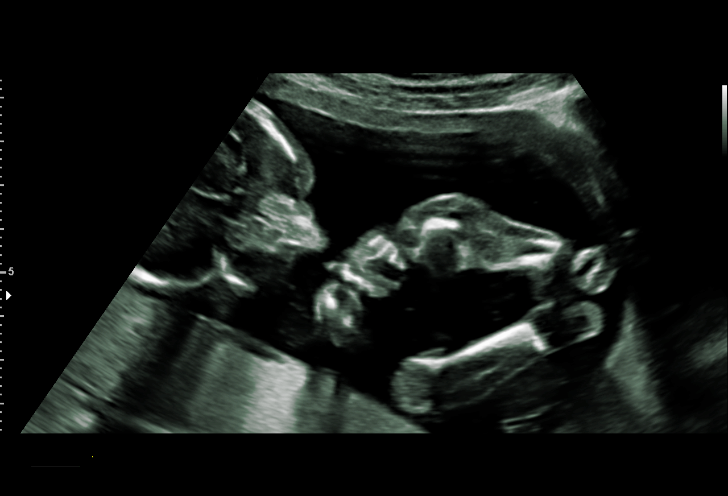
[im 35/104]
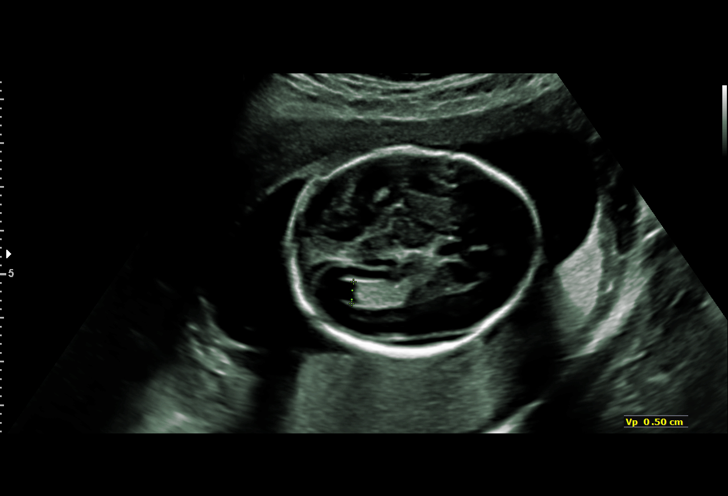
[im 42/104]
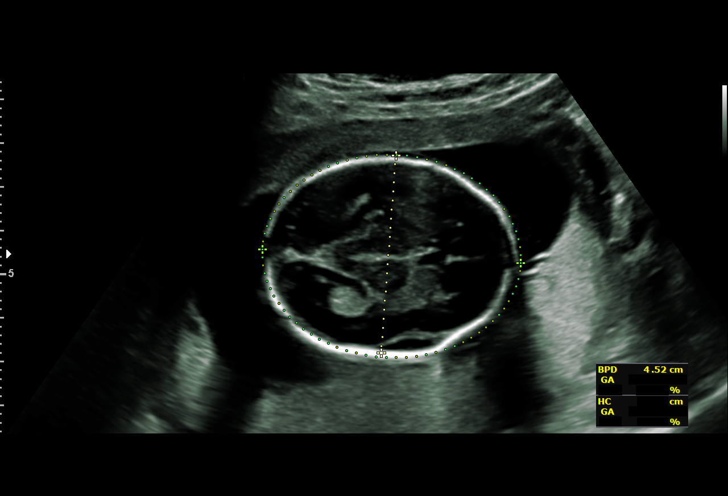
[im 54/104]
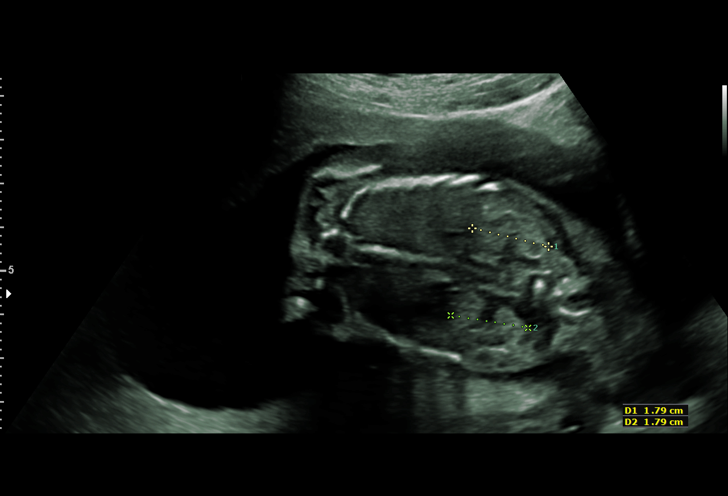
[im 62/104]
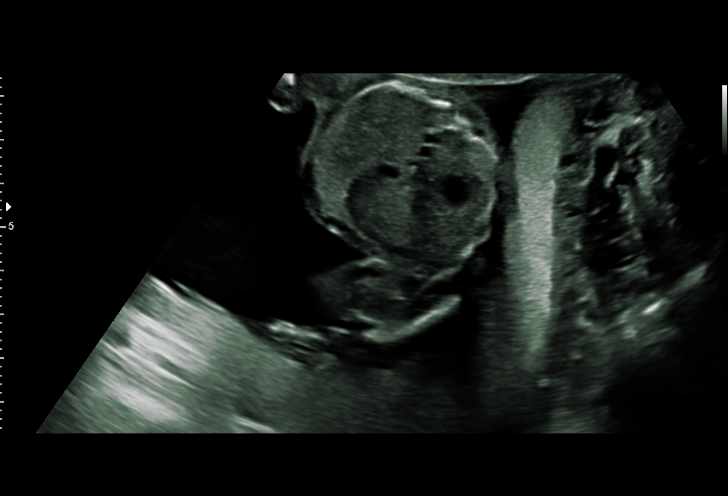
[im 69/104]
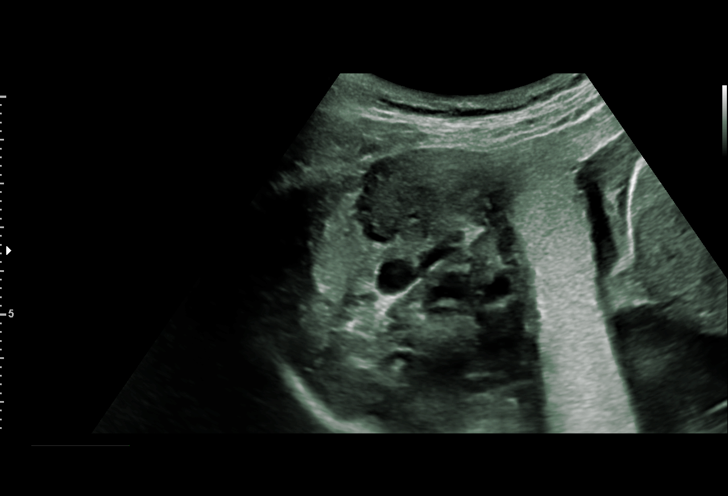
[im 77/104]
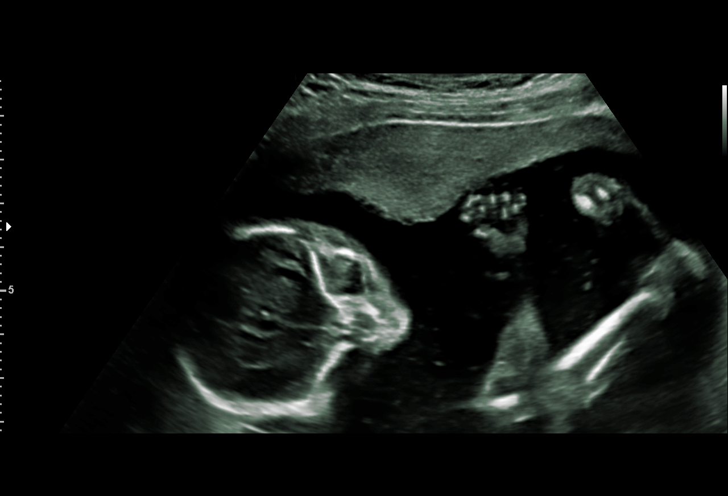
[im 84/104]
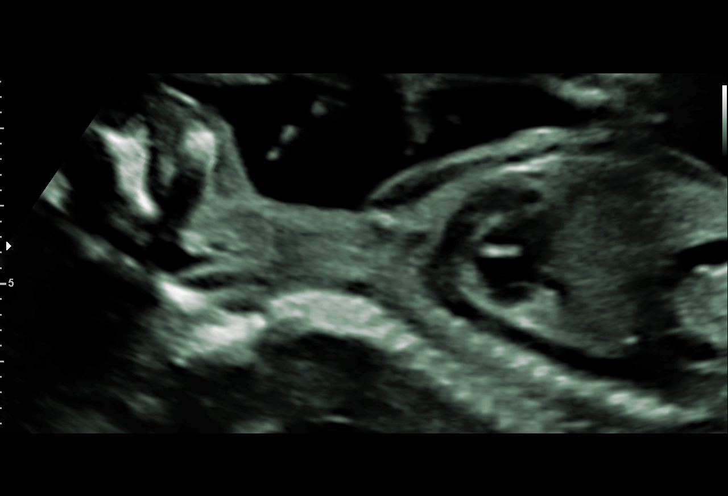
[im 92/104]
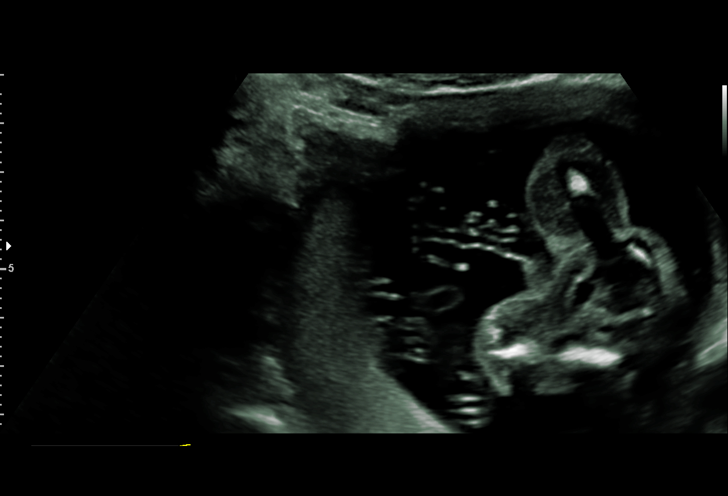
[im 100/104]
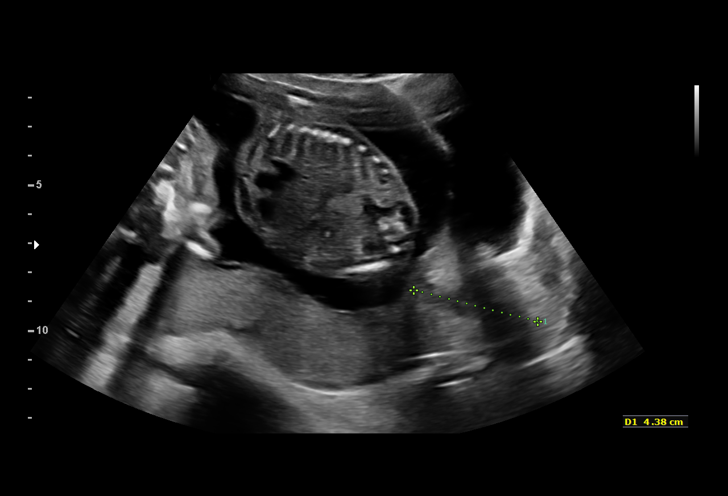

[13 of 28 positions shown; findings below may reference images not displayed]

[id number] [HOSPITAL]

1  IITAMARI OHENOJA           [id number]      [id number]     [id number]
Indications

19 weeks gestation of pregnancy
Encounter for antenatal screening for
malformations
Family history of congenital anomaly - sister
w/coarctation of AO
Low lying placenta, antepartum
OB History

Gravidity:    1
Fetal Evaluation

Num Of Fetuses:     1
Fetal Heart         141
Rate(bpm):
Cardiac Activity:   Observed
Presentation:       Breech
Placenta:           Posterior, low-lying
P. Cord Insertion:  Not well visualized

Amniotic Fluid
AFI FV:      Subjectively within normal limits

Largest Pocket(cm)
4.6
Biometry

BPD:      45.1  mm     G. Age:  19w 4d         47  %    CI:         72.4   %    70 - 86
FL/HC:      18.3   %    16.8 -
HC:      168.6  mm     G. Age:  19w 4d         33  %    HC/AC:      1.14        1.09 -
AC:      147.8  mm     G. Age:  20w 1d         56  %    FL/BPD:     68.3   %
FL:       30.8  mm     G. Age:  19w 4d         37  %    FL/AC:      20.8   %    20 - 24
HUM:      30.3  mm     G. Age:  20w 0d         60  %
CER:      19.4  mm     G. Age:  18w 5d         24  %
NFT:       2.6  mm

LV:          5  mm
CM:        4.5  mm

Est. FW:     313  gm    0 lb 11 oz      50  %
Gestational Age

LMP:           19w 5d        Date:  08/08/17                 EDD:   05/15/18
U/S Today:     19w 5d                                        EDD:   05/15/18
Best:          19w 5d     Det. By:  LMP  (08/08/17)          EDD:   05/15/18
Anatomy

Cranium:               Appears normal         Aortic Arch:            Appears normal
Cavum:                 Appears normal         Ductal Arch:            Appears normal
Ventricles:            Appears normal         Diaphragm:              Appears normal
Choroid Plexus:        Appears normal         Stomach:                Appears normal, left
sided
Cerebellum:            Appears normal         Abdomen:                Appears normal
Posterior Fossa:       Appears normal         Abdominal Wall:         Appears nml (cord
insert, abd wall)
Nuchal Fold:           Appears normal         Cord Vessels:           Appears normal (3
vessel cord)
Face:                  Appears normal         Kidneys:                Appear normal
(orbits and profile)
Lips:                  Appears normal         Bladder:                Appears normal
Thoracic:              Appears normal         Spine:                  Appears normal
Heart:                 Appears normal         Upper Extremities:      Appears normal
(4CH, axis, and
situs)
RVOT:                  Appears normal         Lower Extremities:      Appears normal
LVOT:                  Appears normal
Cervix Uterus Adnexa

Cervix
Length:            4.3  cm.
Normal appearance by transabdominal scan.

Left Ovary
Not visualized. No adnexal mass visualized.

Right Ovary
Within normal limits.
Impression

Ms. Ogaza, Steven Dario1 P0 is here for fetal anatomy scan. On
serum screening (quad), the risks of fetal aneuploidies and
open-neural tube defects are not increased.
Patient's sister had coarctation of aorta and bicuspid aortic
valve.
We performed a fetal anatomy scan. No markers of
aneuploidies or fetal structural defects are seen. Cardiac
anatomy appears normal. Fetal biometry is consistent with
her previously-established dates. Amniotic fluid is normal and
good fetal activity is seen. Patient understands the limitations
of ultrasound in detecting fetal anomalies.

Placenta is posterior and low lying. I reassured the patient
that in a majority of cases, low-lying placenta resolves with
advancing gestation. She does not give history of vaginal
bleeding. I reassured her that abstinence from sexual
intercourse is not necessary.

I also reassured her that the likelihood of her baby having
congenital heart defect is not increased (background risk 1%).
Recommendations

Placental location assessment at 28 to 32 weeks that may be
performed at your office.

## 2018-09-08 ENCOUNTER — Telehealth: Payer: BC Managed Care – PPO | Admitting: Physician Assistant

## 2018-09-08 DIAGNOSIS — J029 Acute pharyngitis, unspecified: Secondary | ICD-10-CM

## 2018-09-08 NOTE — Progress Notes (Signed)
Based on what you shared with me, I feel your condition warrants further evaluation and I recommend that you be seen for a face to face office visit.     NOTE: If you entered your credit card information for this eVisit, you will not be charged. You may see a "hold" on your card for the $35 but that hold will drop off and you will not have a charge processed.  If you are having a true medical emergency please call 911.  If you need an urgent face to face visit, [place] has four urgent care centers for your convenience.    PLEASE NOTE: THE [facility] LOCATIONS AND URGENT CARE CLINICS DO NOT HAVE THE TESTING FOR CORONAVIRUS COVID19 AVAILABLE.  IF YOU FEEL YOU NEED THIS TEST YOU MUST GO TO A TRIAGE LOCATION AT ONE OF THE HOSPITAL EMERGENCY DEPARTMENTS   [email or web address] to reserve your spot online an avoid wait times  [facility] [address], [address] [place], [place] [address] Modified hours of operation: Monday-Friday, 10 AM to 6 PM  Saturday & Sunday 10 AM to 4 PM *Across the street from [employer]  [facility] (New Address!) [address], [address] [place], [place] [address] *Just off [address], across the road from [employer]* Modified hours of operation: Monday-Friday, 10 AM to 5 PM  Closed Saturday & Sunday   The following sites will take your insurance:  . [facility]  [phone] Get Driving Directions Find a Provider at this Location  [address] [place], [place] [address] . 10 am to 8 pm Monday-Friday . 12 pm to 8 pm Saturday-Sunday   . [facility]  [phone] Get Driving Directions Find a Provider at this Location  [address], [address] [place], [place] [address] . 8 am to 8 pm Monday-Friday . 9 am to 6 pm Saturday . 11 am to 6 pm Sunday   . [facility] [place]  [phone] Get Driving Directions  [address] [place],  [address] . 8 am to 8 pm Monday-Friday . 8 am to 4 pm Saturday-Sunday   Your e-visit answers were reviewed by a board certified advanced clinical practitioner to complete your personal care plan.  Thank you for using e-Visits. 

## 2018-09-10 ENCOUNTER — Telehealth (INDEPENDENT_AMBULATORY_CARE_PROVIDER_SITE_OTHER): Payer: BC Managed Care – PPO | Admitting: Family Medicine

## 2018-09-10 ENCOUNTER — Encounter: Payer: Self-pay | Admitting: Family Medicine

## 2018-09-10 VITALS — Temp 97.5°F | Wt 136.7 lb

## 2018-09-10 DIAGNOSIS — J0110 Acute frontal sinusitis, unspecified: Secondary | ICD-10-CM | POA: Diagnosis not present

## 2018-09-10 DIAGNOSIS — N61 Mastitis without abscess: Secondary | ICD-10-CM | POA: Diagnosis not present

## 2018-09-10 MED ORDER — CEPHALEXIN 500 MG PO CAPS: 500.0000 mg | ORAL_CAPSULE | Freq: Four times a day (QID) | ORAL | 0 refills | Status: DC

## 2018-09-10 NOTE — Progress Notes (Signed)
Virtual Visit via Video Note  I connected with Helen Perkins on 09/10/18 at  4:00 PM EDT by a video enabled telemedicine application and verified that I am speaking with the correct person using two identifiers.   I discussed the limitations of evaluation and management by telemedicine and the availability of in person appointments. The patient expressed understanding and agreed to proceed.  Subjective:    CC: breast pain and sinus sxs.    HPI:  Pt reports that she darted having sxs x 2 wks ago. she was feeling engorged after trying to cut back on the amount that she was breast-feeding.  She thought her her body was adjusting to not pumping. The engorgement got better initiall.  Now starting to hurt again on Monday.  The nipples are moe senstive and her daughter isn't feeding as well. Her pain is mostly behind the nipple.   Tuesday had body aches,chills,sweats,no temp. Started some tylenol and IBU.  She can feel a knot in the center of her breast. She feels better after she pumps and gets temporary relief. .    She has had sinus pain and pressure and ST for about one week.Seen for an E visit for a sore throat on 09/08/2018. She now feels that she may have been fighting off a sinus infection. She has pain up in her head. She said the only way she could get the cold out was to "hock" it out. If feels like it is more in her upper sinuses and forehead.  She was using tylenol and IBU for this also. Her throat is mildly red.  She has had a lot of drainage. Started her nett pot  But not really helping. Yellow sputum that she is coughing up.     Past medical history, Surgical history, Family history not pertinant except as noted below, Social history, Allergies, and medications have been entered into the medical record, reviewed, and corrections made.   Review of Systems: No fevers, chills, night sweats, weight loss, chest pain, or shortness of breath.   Objective:    General: Speaking clearly in  complete sentences without any shortness of breath.  Alert and oriented x3.  Normal judgment. No apparent acute distress. She appears tired and has circles under her eyes.  Well groomed.      Impression and Recommendations:    Mastitis-without doing a formal exam I think what she is describing is most consistent with mastitis and is not uncommonly triggered when women try to decrease the amount that they are breast-feeding.  Typically dicloxacillin is first-line but since she is also having sinus symptoms we will go ahead and treat with cephalexin 500 mg 4 times a day.  We also discussed pumping after the breast-feed if needed to completely empty her breast and get some relief, since her daughter is not completely nursing.  We discussed that if she wants to taper off her breast-feeding to start spacing the timing instead of the amount at least initially.  Acute sinusitis- will treat with Keflex.  This is not first-line therapy for sinusitis so if she is not improving then please call back next week.  Okay to continue Tylenol and or Motrin as needed.  Continue with Nettie pot.      I discussed the assessment and treatment plan with the patient. The patient was provided an opportunity to ask questions and all were answered. The patient agreed with the plan and demonstrated an understanding of the instructions.   The patient  was advised to call back or seek an in-person evaluation if the symptoms worsen or if the condition fails to improve as anticipated.   Beatrice Lecher, MD

## 2018-09-10 NOTE — Progress Notes (Signed)
Pt reports that she has having sxs x 2 wks ago she was feeling engorged she thought her her body was adjusting to not pumping as much as she usually does.   Monday she noticed more pain. She can feel a knot in the center of her breast  Tuesday body aches,chills,sweats,no temp, tylenol and IBU.   Seen for an E visit for a sore throat on 09/08/2018. She now feels that she may have been fighting off a sinus infection. She has pain up in her head. She said the only way she could get the cold out was to "hock" it out. She was using tylenol and IBU for this also.Heath Gold, CMA

## 2019-03-23 ENCOUNTER — Telehealth: Payer: Self-pay | Admitting: Physician Assistant

## 2019-03-23 NOTE — Telephone Encounter (Signed)
Is she checking her blood pressure? I could order the labs but I would like to see her first because I might add something that I am not thinking about right now.

## 2019-03-23 NOTE — Telephone Encounter (Signed)
Appt made with patient.  

## 2019-03-23 NOTE — Telephone Encounter (Signed)
Patient stating that she has had dizzy spells for about a week now. Every day almost. Wanted to know if she can have labs sent downstairs and then make an appointment. Happening morning and evening. This is happening with and without mask. Patient pregnancy test came out negative. Please contact and advise.

## 2019-03-24 ENCOUNTER — Other Ambulatory Visit: Payer: Self-pay

## 2019-03-24 ENCOUNTER — Ambulatory Visit (INDEPENDENT_AMBULATORY_CARE_PROVIDER_SITE_OTHER): Payer: BC Managed Care – PPO | Admitting: Physician Assistant

## 2019-03-24 ENCOUNTER — Encounter: Payer: Self-pay | Admitting: Physician Assistant

## 2019-03-24 VITALS — Ht 67.0 in | Wt 125.0 lb

## 2019-03-24 DIAGNOSIS — R42 Dizziness and giddiness: Secondary | ICD-10-CM | POA: Diagnosis not present

## 2019-03-24 NOTE — Progress Notes (Signed)
   Subjective:    Patient ID: Helen Perkins, female    DOB: [birth year], 33 y.o.   MRN: [id number]  HPI  Pt is a 33 yo female who presents to the clinic with dizziness and lightness that has been occurring intermittently since last Wednesday. It can last 1 minute to 3minutes. She feels "like my head is spinning and like I am being pulled down". No vision changes, hearing loss, sinus pressure, headaches. She does get a little nauseated. Admits to getting motion sick. She is trying to get pregnant and took a pregnancy test and was negative. She isn't aware of anything that she does to make it come on but usually when she moves her head quickly.   .. Active Ambulatory Problems    Diagnosis Date Noted  . ANXIETY 08/05/2009  . DEPRESSION 08/05/2009  . HEADACHE 08/05/2009  . Recurrent infections 06/14/2014  . Abdominal pain, left lower quadrant 07/05/2014  . CN (constipation) 07/05/2014  . Leukopenia 07/05/2014  . Thrombocytopenia ([place]) 07/05/2014  . History of Clostridium difficile colitis 07/05/2014  . Menorrhagia with regular cycle 08/30/2015  . Memory difficulties 08/30/2015  . No energy 08/30/2015  . 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 Breech presentation 05/09/2018  . Postpartum care following cesarean delivery (12/7) 05/09/2018  . 1C/S - Breech 12/7 05/09/2018  . Maternal anemia, with delivery 05/10/2018   Resolved Ambulatory Problems    Diagnosis Date Noted  . Acute pharyngitis 08/05/2009  . DIARRHEA, BLOODY 08/05/2009  . CAP (community acquired pneumonia) 05/31/2014   Past Medical History:  Diagnosis Date  . Allergy   . Anemia   . Clostridium difficile colitis   . Depression   . Headache   . History of iron deficiency anemia   . History of migraine headaches   . Mononucleosis 2010  . Psoriasis       Review of Systems  All other systems reviewed and are negative.      Objective:   Physical Exam Vitals signs reviewed.  Constitutional:      Appearance: Normal  appearance.  Cardiovascular:     Rate and Rhythm: Normal rate and regular rhythm.     Pulses: Normal pulses.  Pulmonary:     Effort: Pulmonary effort is normal.     Breath sounds: Normal breath sounds.  Neurological:     General: No focal deficit present.     Mental Status: She is alert and oriented to person, place, and time.     Cranial Nerves: No cranial nerve deficit.     Sensory: No sensory deficit.     Motor: No weakness.     Coordination: Coordination normal.     Gait: Gait normal.     Deep Tendon Reflexes: Reflexes normal.     Comments: Some very scant nystagmus and dizziness reported with dix hallpike to the left.   Psychiatric:        Mood and Affect: Mood normal.           Assessment & Plan:  Marland KitchenMarland KitchenDejon was seen today for dizziness.  Diagnoses and all orders for this visit:  Vertigo -     CBC with Differential/Platelet -     TSH -     COMPLETE METABOLIC PANEL WITH GFR  orthostatics normal.  suspect BPPV to the left. Epley maneuvers given to do 3 times a day 3 rounds. Labs ordered. Consider vestibular rehab if not improving.

## 2019-03-25 ENCOUNTER — Encounter: Payer: Self-pay | Admitting: Physician Assistant

## 2019-03-25 LAB — COMPLETE METABOLIC PANEL WITH GFR
AG Ratio: 2 (calc) (ref 1.0–2.5)
ALT: 10 U/L (ref 6–29)
AST: 13 U/L (ref 10–30)
Albumin: 4.5 g/dL (ref 3.6–5.1)
Alkaline phosphatase (APISO): 61 U/L (ref 31–125)
BUN: 13 mg/dL (ref 7–25)
CO2: 27 mmol/L (ref 20–32)
Calcium: 9.1 mg/dL (ref 8.6–10.2)
Chloride: 105 mmol/L (ref 98–110)
Creat: 0.69 mg/dL (ref 0.50–1.10)
GFR, Est African American: 134 mL/min/{1.73_m2} (ref >=60)
GFR, Est Non African American: 115 mL/min/{1.73_m2} (ref >=60)
Globulin: 2.3 g/dL (calc) (ref 1.9–3.7)
Glucose, Bld: 95 mg/dL (ref 65–99)
Potassium: 4.1 mmol/L (ref 3.5–5.3)
Sodium: 139 mmol/L (ref 135–146)
Total Bilirubin: 0.3 mg/dL (ref 0.2–1.2)
Total Protein: 6.8 g/dL (ref 6.1–8.1)

## 2019-03-25 LAB — CBC WITH DIFFERENTIAL/PLATELET
Absolute Monocytes: 454 cells/uL (ref 200–950)
Basophils Absolute: 53 cells/uL (ref 0–200)
Basophils Relative: 0.9 %
Eosinophils Absolute: 354 cells/uL (ref 15–500)
Eosinophils Relative: 6 %
HCT: 37.6 % (ref 35.0–45.0)
Hemoglobin: 12.6 g/dL (ref 11.7–15.5)
Lymphs Abs: 1912 cells/uL (ref 850–3900)
MCH: 30 pg (ref 27.0–33.0)
MCHC: 33.5 g/dL (ref 32.0–36.0)
MCV: 89.5 fL (ref 80.0–100.0)
MPV: 11.4 fL (ref 7.5–12.5)
Monocytes Relative: 7.7 %
Neutro Abs: 3127 cells/uL (ref 1500–7800)
Neutrophils Relative %: 53 %
Platelets: 261 10*3/uL (ref 140–400)
RBC: 4.2 10*6/uL (ref 3.80–5.10)
RDW: 12.2 % (ref 11.0–15.0)
Total Lymphocyte: 32.4 %
WBC: 5.9 10*3/uL (ref 3.8–10.8)

## 2019-03-25 LAB — TSH: TSH: 1.49 mIU/L

## 2019-03-25 NOTE — Progress Notes (Signed)
Helen Perkins,   You are not anemic. Thyroid looks great. Kidney, liver, glucose look great. electrolytes look great.   Have you been doing your epley maneuvers? How are you feeling?

## 2019-04-13 ENCOUNTER — Other Ambulatory Visit: Payer: Self-pay

## 2019-04-13 DIAGNOSIS — Z20822 Contact with and (suspected) exposure to covid-19: Secondary | ICD-10-CM

## 2019-04-15 LAB — NOVEL CORONAVIRUS, NAA: SARS-CoV-2, NAA: NOT DETECTED

## 2019-06-04 NOTE — L&D Delivery Note (Signed)
Delivery Note At 12:26 PM a viable female was delivered via VBAC, loose nuchal cord reduced x1. Spontaneous (Presentation: Left Occiput Anterior).  APGAR: 9, 9; weight pending for skin to skin  .   Placenta status: Spontaneous, Intact.  Cord: 3 vessels with the following complications: None.  Cord pH: n/a  Anesthesia: Epidural Episiotomy: None Lacerations: Sulcus;2nd degree Suture Repair: vicryl rapide Est. Blood Loss (mL): 774  Mom to postpartum.  Baby to Couplet care / Skin to Skin.  Helen Perkins A Helen Perkins 03/17/2020, 1:17 PM

## 2019-07-16 LAB — OB RESULTS CONSOLE ABO/RH: RH Type: POSITIVE

## 2019-07-16 LAB — OB RESULTS CONSOLE ANTIBODY SCREEN: Antibody Screen: NEGATIVE

## 2019-08-20 LAB — OB RESULTS CONSOLE RPR: RPR: NONREACTIVE

## 2019-08-20 LAB — OB RESULTS CONSOLE HIV ANTIBODY (ROUTINE TESTING): HIV: NONREACTIVE

## 2019-08-20 LAB — OB RESULTS CONSOLE HEPATITIS B SURFACE ANTIGEN: Hepatitis B Surface Ag: NEGATIVE

## 2019-08-20 LAB — OB RESULTS CONSOLE RUBELLA ANTIBODY, IGM: Rubella: IMMUNE

## 2019-11-19 ENCOUNTER — Encounter: Payer: Self-pay | Admitting: Medical-Surgical

## 2019-11-19 ENCOUNTER — Telehealth (INDEPENDENT_AMBULATORY_CARE_PROVIDER_SITE_OTHER): Payer: BC Managed Care – PPO | Admitting: Medical-Surgical

## 2019-11-19 VITALS — Temp 97.3°F

## 2019-11-19 DIAGNOSIS — J0100 Acute maxillary sinusitis, unspecified: Secondary | ICD-10-CM | POA: Diagnosis not present

## 2019-11-19 MED ORDER — AMOXICILLIN 500 MG PO CAPS: 500.0000 mg | ORAL_CAPSULE | Freq: Three times a day (TID) | ORAL | 0 refills | Status: DC

## 2019-11-19 NOTE — Progress Notes (Signed)
Virtual Visit via Video Note  I connected with Helen Perkins on 11/19/19 at 10:10 AM EDT by a video enabled telemedicine application and verified that I am speaking with the correct person using two identifiers.   I discussed the limitations of evaluation and management by telemedicine and the availability of in person appointments. The patient expressed understanding and agreed to proceed.  Patient location: home Provider locations: office  Subjective:    CC: Upper respiratory symptoms  HPI: Very pleasant 34 year old female who is [redacted] weeks pregnant presenting today via MyChart video visit with reports of 5 days of upper respiratory symptoms including postnasal drip cough productive of green mucus, sinus congestion, headache, facial pain and pressure, ears popping, and sore throat in the mornings.  She reports she is having increased postnasal drip which increases her cough.  Some tenderness over the frontal sinuses yesterday and notes that she is now also tender over the maxillary sinuses.  Symptoms are worse at night and she often wakes up in the morning with dry nasal passages but increased sinus pressure.  Denies fever, chills, shortness of breath, chest pain, rhinorrhea, GI symptoms.  Has not had her Covid vaccine as she is severely allergic to the flu vaccine and they are unsure if she should attempt Covid vaccine while pregnant.  Reports that her 63-month-old daughter was sick last week with similar symptoms that she picked up at daycare.  Her husband has also had the same illness and was treated with levofloxacin.  Past medical history, Surgical history, Family history not pertinant except as noted below, Social history, Allergies, and medications have been entered into the medical record, reviewed, and corrections made.   Review of Systems: See HPI for pertinent positives and negatives.   Objective:    General: Speaking clearly in complete sentences without any shortness of breath.   Alert and oriented x3.  Normal judgment. No apparent acute distress.  Impression and Recommendations:    1. Acute non-recurrent maxillary sinusitis Amoxicillin 500mg  every 8 hours for 7 days.  Discussed supportive treatment including nasal saline sprays/rinses, warm teas with honey, and over-the-counter pregnancy friendly remedies.  May benefit from a coolmist humidifier at night. - amoxicillin (AMOXIL) 500 MG capsule; Take 1 capsule (500 mg total) by mouth 3 (three) times daily.  Dispense: 21 capsule; Refill: 0  Return if symptoms worsen or fail to improve.   20 minutes of non-face-to-face time was provided during this encounter.   I discussed the assessment and treatment plan with the patient. The patient was provided an opportunity to ask questions and all were answered. The patient agreed with the plan and demonstrated an understanding of the instructions.   The patient was advised to call back or seek an in-person evaluation if the symptoms worsen or if the condition fails to improve as anticipated.  , DNP, APRN, FNP-BC [facility]

## 2020-03-05 ENCOUNTER — Other Ambulatory Visit: Payer: Self-pay

## 2020-03-05 ENCOUNTER — Encounter (HOSPITAL_COMMUNITY): Payer: Self-pay | Admitting: Obstetrics & Gynecology

## 2020-03-05 ENCOUNTER — Inpatient Hospital Stay (HOSPITAL_COMMUNITY)
Admission: AD | Admit: 2020-03-05 | Discharge: 2020-03-06 | Disposition: A | Discharge status: Home / Self Care | Payer: BC Managed Care – PPO | Source: Ambulatory Visit | Attending: Obstetrics & Gynecology | Admitting: Obstetrics & Gynecology

## 2020-03-05 DIAGNOSIS — O471 False labor at or after 37 completed weeks of gestation: Secondary | ICD-10-CM | POA: Insufficient documentation

## 2020-03-05 DIAGNOSIS — Z3A38 38 weeks gestation of pregnancy: Secondary | ICD-10-CM | POA: Insufficient documentation

## 2020-03-05 LAB — POCT FERN TEST: POCT Fern Test: NEGATIVE

## 2020-03-05 NOTE — MAU Note (Signed)
Patient had 2 gushes of fluid around 1800 and 1810, clear fluid noted on her panty liner. Patient has a scheduled appoint tomorrow for u/s to check presentation. Primary c/s with previous delivery for breech presentation.

## 2020-03-06 DIAGNOSIS — O471 False labor at or after 37 completed weeks of gestation: Secondary | ICD-10-CM

## 2020-03-06 DIAGNOSIS — Z3A38 38 weeks gestation of pregnancy: Secondary | ICD-10-CM | POA: Diagnosis not present

## 2020-03-06 LAB — OB RESULTS CONSOLE GBS: GBS: NEGATIVE

## 2020-03-06 NOTE — MAU Provider Note (Signed)
°  S: Ms. Helen Perkins is a 34 y.o. G2P1001 at [redacted]w[redacted]d  who presents to MAU today complaining contractions q 5-10 minutes since 2000. She denies vaginal bleeding. She denies LOF. She reports two episodes of increased discharge today but feels like it was discharge. She reports normal fetal movement.    O: BP 118/68    Pulse 75    Temp 98.4 F (36.9 C) (Oral)    Resp 18    Ht 5\' 6"  (1.676 m)    Wt 71.2 kg    LMP 06/11/2019    SpO2 98% Comment: Room Air    BMI 25.34 kg/m  GENERAL: Well-developed, well-nourished female in no acute distress.  HEAD: Normocephalic, atraumatic.  CHEST: Normal effort of breathing, regular heart rate ABDOMEN: Soft, nontender, gravid  Cervical exam:  Dilation: Closed Effacement (%): Thick Cervical Position: Posterior Presentation: Undeterminable Exam by:: [email or web address] RN    Fetal Monitoring: Baseline: 130 Variability: moderate Accelerations: 15x15 Decelerations: none Contractions: 5-10  Patient rechecked after 1.5 hours with no cervical change. Patient reports contractions are the same as her braxton hicks and is planning a TOLAC  A: SIUP at [redacted]w[redacted]d  False labor  P: -Discharge home in stable condition -Labor precautions discussed -Patient advised to follow-up with OB as scheduled for prenatal care -Patient may return to MAU as needed or if her condition were to change or worsen   [redacted]w[redacted]d, Rolm Bookbinder 03/06/2020 12:47 AM

## 2020-03-06 NOTE — Discharge Instructions (Signed)
Safe Medications in Pregnancy  ° °Acne: °Benzoyl Peroxide °Salicylic Acid ° °Backache/Headache: °Tylenol: 2 regular strength every 4 hours OR °             2 Extra strength every 6 hours ° °Colds/Coughs/Allergies: °Benadryl (alcohol free) 25 mg every 6 hours as needed °Breath right strips °Claritin °Cepacol throat lozenges °Chloraseptic throat spray °Cold-Eeze- up to three times per day °Cough drops, alcohol free °Flonase (by prescription only) °Guaifenesin °Mucinex °Robitussin DM (plain only, alcohol free) °Saline nasal spray/drops °Sudafed (pseudoephedrine) & Actifed ** use only after [redacted] weeks gestation and if you do not have high blood pressure °Tylenol °Vicks Vaporub °Zinc lozenges °Zyrtec  ° °Constipation: °Colace °Ducolax suppositories °Fleet enema °Glycerin suppositories °Metamucil °Milk of magnesia °Miralax °Senokot °Smooth move tea ° °Diarrhea: °Kaopectate °Imodium A-D ° °*NO pepto Bismol ° °Hemorrhoids: °Anusol °Anusol HC °Preparation H °Tucks ° °Indigestion: °Tums °Maalox °Mylanta °Zantac  °Pepcid ° °Insomnia: °Benadryl (alcohol free) 25mg every 6 hours as needed °Tylenol PM °Unisom, no Gelcaps ° °Leg Cramps: °Tums °MagGel ° °Nausea/Vomiting:  °Bonine °Dramamine °Emetrol °Ginger extract °Sea bands °Meclizine  °Nausea medication to take during pregnancy:  °Unisom (doxylamine succinate 25 mg tablets) Take one tablet daily at bedtime. If symptoms are not adequately controlled, the dose can be increased to a maximum recommended dose of two tablets daily (1/2 tablet in the morning, 1/2 tablet mid-afternoon and one at bedtime). °Vitamin B6 100mg tablets. Take one tablet twice a day (up to 200 mg per day). ° °Skin Rashes: °Aveeno products °Benadryl cream or 25mg every 6 hours as needed °Calamine Lotion °1% cortisone cream ° °Yeast infection: °Gyne-lotrimin 7 °Monistat 7 ° ° °**If taking multiple medications, please check labels to avoid duplicating the same active ingredients °**take medication as directed on  the label °** Do not exceed 4000 mg of tylenol in 24 hours °**Do not take medications that contain aspirin or ibuprofen ° ° ° ° °Braxton Hicks Contractions °Contractions of the uterus can occur throughout pregnancy, but they are not always a sign that you are in labor. You may have practice contractions called Braxton Hicks contractions. These false labor contractions are sometimes confused with true labor. °What are Braxton Hicks contractions? °Braxton Hicks contractions are tightening movements that occur in the muscles of the uterus before labor. Unlike true labor contractions, these contractions do not result in opening (dilation) and thinning of the cervix. Toward the end of pregnancy (32-34 weeks), Braxton Hicks contractions can happen more often and may become stronger. These contractions are sometimes difficult to tell apart from true labor because they can be very uncomfortable. You should not feel embarrassed if you go to the hospital with false labor. °Sometimes, the only way to tell if you are in true labor is for your health care provider to look for changes in the cervix. The health care provider will do a physical exam and may monitor your contractions. If you are not in true labor, the exam should show that your cervix is not dilating and your water has not broken. °If there are no other health problems associated with your pregnancy, it is completely safe for you to be sent home with false labor. You may continue to have Braxton Hicks contractions until you go into true labor. °How to tell the difference between true labor and false labor °True labor °· Contractions last 30-70 seconds. °· Contractions become very regular. °· Discomfort is usually felt in the top of the uterus, and it spreads to the   lower abdomen and low back. °· Contractions do not go away with walking. °· Contractions usually become more intense and increase in frequency. °· The cervix dilates and gets thinner. °False  labor °· Contractions are usually shorter and not as strong as true labor contractions. °· Contractions are usually irregular. °· Contractions are often felt in the front of the lower abdomen and in the groin. °· Contractions may go away when you walk around or change positions while lying down. °· Contractions get weaker and are shorter-lasting as time goes on. °· The cervix usually does not dilate or become thin. °Follow these instructions at home: ° °· Take over-the-counter and prescription medicines only as told by your health care provider. °· Keep up with your usual exercises and follow other instructions from your health care provider. °· Eat and drink lightly if you think you are going into labor. °· If Braxton Hicks contractions are making you uncomfortable: °? Change your position from lying down or resting to walking, or change from walking to resting. °? Sit and rest in a tub of warm water. °? Drink enough fluid to keep your urine pale yellow. Dehydration may cause these contractions. °? Do slow and deep breathing several times an hour. °· Keep all follow-up prenatal visits as told by your health care provider. This is important. °Contact a health care provider if: °· You have a fever. °· You have continuous pain in your abdomen. °Get help right away if: °· Your contractions become stronger, more regular, and closer together. °· You have fluid leaking or gushing from your vagina. °· You pass blood-tinged mucus (bloody show). °· You have bleeding from your vagina. °· You have low back pain that you never had before. °· You feel your baby’s head pushing down and causing pelvic pressure. °· Your baby is not moving inside you as much as it used to. °Summary °· Contractions that occur before labor are called Braxton Hicks contractions, false labor, or practice contractions. °· Braxton Hicks contractions are usually shorter, weaker, farther apart, and less regular than true labor contractions. True labor  contractions usually become progressively stronger and regular, and they become more frequent. °· Manage discomfort from Braxton Hicks contractions by changing position, resting in a warm bath, drinking plenty of water, or practicing deep breathing. °This information is not intended to replace advice given to you by your health care provider. Make sure you discuss any questions you have with your health care provider. °Document Revised: 05/02/2017 Document Reviewed: 10/03/2016 °Elsevier Patient Education © 2020 [employer] ° °

## 2020-03-06 NOTE — MAU Note (Signed)
Patient discharged with labor instructions and follow-up with ultrasound 10/4 at the office to determine presentation. Pre-e s/s reviewed and patient and FOB verbalizes understanding.

## 2020-03-14 ENCOUNTER — Telehealth (HOSPITAL_COMMUNITY): Payer: Self-pay | Admitting: *Deleted

## 2020-03-14 ENCOUNTER — Encounter (HOSPITAL_COMMUNITY): Payer: Self-pay | Admitting: *Deleted

## 2020-03-14 NOTE — Telephone Encounter (Signed)
Preadmission screen  

## 2020-03-17 ENCOUNTER — Encounter (HOSPITAL_COMMUNITY): Payer: Self-pay | Admitting: Obstetrics and Gynecology

## 2020-03-17 ENCOUNTER — Other Ambulatory Visit (HOSPITAL_COMMUNITY): Payer: BC Managed Care – PPO | Attending: Obstetrics

## 2020-03-17 ENCOUNTER — Other Ambulatory Visit: Payer: Self-pay

## 2020-03-17 ENCOUNTER — Inpatient Hospital Stay (HOSPITAL_COMMUNITY): Payer: BC Managed Care – PPO | Admitting: Anesthesiology

## 2020-03-17 ENCOUNTER — Inpatient Hospital Stay (HOSPITAL_COMMUNITY)
Admission: AD | Admit: 2020-03-17 | Discharge: 2020-03-19 | DRG: 806 | Disposition: A | Discharge status: Home / Self Care | Payer: BC Managed Care – PPO | Attending: Obstetrics and Gynecology | Admitting: Obstetrics and Gynecology

## 2020-03-17 DIAGNOSIS — O34219 Maternal care for unspecified type scar from previous cesarean delivery: Secondary | ICD-10-CM | POA: Diagnosis present

## 2020-03-17 DIAGNOSIS — Z20822 Contact with and (suspected) exposure to covid-19: Secondary | ICD-10-CM | POA: Diagnosis present

## 2020-03-17 DIAGNOSIS — Z3A40 40 weeks gestation of pregnancy: Secondary | ICD-10-CM | POA: Diagnosis not present

## 2020-03-17 DIAGNOSIS — D62 Acute posthemorrhagic anemia: Secondary | ICD-10-CM | POA: Diagnosis not present

## 2020-03-17 DIAGNOSIS — O2243 Hemorrhoids in pregnancy, third trimester: Secondary | ICD-10-CM | POA: Diagnosis present

## 2020-03-17 DIAGNOSIS — O26893 Other specified pregnancy related conditions, third trimester: Secondary | ICD-10-CM | POA: Diagnosis present

## 2020-03-17 DIAGNOSIS — O9081 Anemia of the puerperium: Secondary | ICD-10-CM | POA: Diagnosis not present

## 2020-03-17 LAB — CBC
HCT: 41.5 % (ref 36.0–46.0)
Hemoglobin: 13.6 g/dL (ref 12.0–15.0)
MCH: 29.6 pg (ref 26.0–34.0)
MCHC: 32.8 g/dL (ref 30.0–36.0)
MCV: 90.4 fL (ref 80.0–100.0)
Platelets: 167 10*3/uL (ref 150–400)
RBC: 4.59 MIL/uL (ref 3.87–5.11)
RDW: 13.8 % (ref 11.5–15.5)
WBC: 10 10*3/uL (ref 4.0–10.5)
nRBC: 0 % (ref 0.0–0.2)

## 2020-03-17 LAB — RPR: RPR Ser Ql: NONREACTIVE

## 2020-03-17 LAB — TYPE AND SCREEN
ABO/RH(D): B POS
Antibody Screen: NEGATIVE

## 2020-03-17 LAB — RESPIRATORY PANEL BY RT PCR (FLU A&B, COVID)
Influenza A by PCR: NEGATIVE
Influenza B by PCR: NEGATIVE
SARS Coronavirus 2 by RT PCR: NEGATIVE

## 2020-03-17 MED ORDER — OXYTOCIN BOLUS FROM INFUSION
333.0000 mL | Freq: Once | INTRAVENOUS | Status: AC
  Administered 2020-03-17: 333 mL via INTRAVENOUS

## 2020-03-17 MED ORDER — EPHEDRINE 5 MG/ML INJ: 10.0000 mg | INTRAVENOUS | Status: DC | PRN

## 2020-03-17 MED ORDER — FLEET ENEMA 7-19 GM/118ML RE ENEM: 1.0000 | ENEMA | RECTAL | Status: DC | PRN

## 2020-03-17 MED ORDER — SOD CITRATE-CITRIC ACID 500-334 MG/5ML PO SOLN: 30.0000 mL | ORAL | Status: DC | PRN

## 2020-03-17 MED ORDER — DIPHENHYDRAMINE HCL 50 MG/ML IJ SOLN: 12.5000 mg | INTRAMUSCULAR | Status: DC | PRN

## 2020-03-17 MED ORDER — LACTATED RINGERS IV SOLN
500.0000 mL | Freq: Once | INTRAVENOUS | Status: AC
  Administered 2020-03-17: 500 mL via INTRAVENOUS

## 2020-03-17 MED ORDER — ACETAMINOPHEN 325 MG PO TABS: 650.0000 mg | ORAL_TABLET | ORAL | Status: DC | PRN

## 2020-03-17 MED ORDER — IBUPROFEN 600 MG PO TABS
600.0000 mg | ORAL_TABLET | Freq: Four times a day (QID) | ORAL | Status: DC
  Administered 2020-03-17 – 2020-03-18 (×3): 600 mg via ORAL
  Filled 2020-03-17 (×3): qty 1

## 2020-03-17 MED ORDER — FENTANYL-BUPIVACAINE-NACL 0.5-0.125-0.9 MG/250ML-% EP SOLN
EPIDURAL | Status: AC
  Filled 2020-03-17: qty 250

## 2020-03-17 MED ORDER — ACETAMINOPHEN 500 MG PO TABS
1000.0000 mg | ORAL_TABLET | ORAL | Status: AC
  Administered 2020-03-17: 1000 mg via ORAL
  Filled 2020-03-17: qty 2

## 2020-03-17 MED ORDER — LACTATED RINGERS IV SOLN: INTRAVENOUS | Status: DC

## 2020-03-17 MED ORDER — OXYCODONE HCL 5 MG PO TABS: 10.0000 mg | ORAL_TABLET | ORAL | Status: DC | PRN

## 2020-03-17 MED ORDER — OXYCODONE-ACETAMINOPHEN 5-325 MG PO TABS: 2.0000 | ORAL_TABLET | ORAL | Status: DC | PRN

## 2020-03-17 MED ORDER — LIDOCAINE HCL (PF) 1 % IJ SOLN
INTRAMUSCULAR | Status: DC | PRN
  Administered 2020-03-17: 5 mL via EPIDURAL

## 2020-03-17 MED ORDER — OXYTOCIN-SODIUM CHLORIDE 30-0.9 UT/500ML-% IV SOLN
2.5000 [IU]/h | INTRAVENOUS | Status: DC
  Filled 2020-03-17: qty 500

## 2020-03-17 MED ORDER — DIPHENHYDRAMINE HCL 25 MG PO CAPS: 25.0000 mg | ORAL_CAPSULE | Freq: Four times a day (QID) | ORAL | Status: DC | PRN

## 2020-03-17 MED ORDER — ONDANSETRON HCL 4 MG/2ML IJ SOLN: 4.0000 mg | INTRAMUSCULAR | Status: DC | PRN

## 2020-03-17 MED ORDER — ACETAMINOPHEN 325 MG PO TABS
650.0000 mg | ORAL_TABLET | ORAL | Status: DC | PRN
  Administered 2020-03-17 – 2020-03-18 (×2): 650 mg via ORAL
  Filled 2020-03-17 (×2): qty 2

## 2020-03-17 MED ORDER — DIBUCAINE (PERIANAL) 1 % EX OINT: 1.0000 "application " | TOPICAL_OINTMENT | CUTANEOUS | Status: DC | PRN

## 2020-03-17 MED ORDER — ONDANSETRON HCL 4 MG PO TABS: 4.0000 mg | ORAL_TABLET | ORAL | Status: DC | PRN

## 2020-03-17 MED ORDER — PHENYLEPHRINE 40 MCG/ML (10ML) SYRINGE FOR IV PUSH (FOR BLOOD PRESSURE SUPPORT): 80.0000 ug | PREFILLED_SYRINGE | INTRAVENOUS | Status: DC | PRN

## 2020-03-17 MED ORDER — BENZOCAINE-MENTHOL 20-0.5 % EX AERO
1.0000 "application " | INHALATION_SPRAY | CUTANEOUS | Status: DC | PRN
  Administered 2020-03-17: 1 via TOPICAL
  Filled 2020-03-17 (×2): qty 56

## 2020-03-17 MED ORDER — SENNOSIDES-DOCUSATE SODIUM 8.6-50 MG PO TABS
2.0000 | ORAL_TABLET | ORAL | Status: DC
  Administered 2020-03-17 – 2020-03-18 (×2): 2 via ORAL
  Filled 2020-03-17 (×2): qty 2

## 2020-03-17 MED ORDER — ONDANSETRON HCL 4 MG/2ML IJ SOLN
4.0000 mg | Freq: Four times a day (QID) | INTRAMUSCULAR | Status: DC | PRN
  Administered 2020-03-17: 4 mg via INTRAVENOUS
  Filled 2020-03-17: qty 2

## 2020-03-17 MED ORDER — SODIUM CHLORIDE (PF) 0.9 % IJ SOLN
INTRAMUSCULAR | Status: DC | PRN
Start: 2020-03-17 — End: 2020-03-17
  Administered 2020-03-17: 12 mL/h via EPIDURAL

## 2020-03-17 MED ORDER — TETANUS-DIPHTH-ACELL PERTUSSIS 5-2.5-18.5 LF-MCG/0.5 IM SUSP: 0.5000 mL | Freq: Once | INTRAMUSCULAR | Status: DC

## 2020-03-17 MED ORDER — OXYCODONE HCL 5 MG PO TABS
5.0000 mg | ORAL_TABLET | ORAL | Status: DC | PRN
  Administered 2020-03-19: 5 mg via ORAL
  Filled 2020-03-17: qty 1

## 2020-03-17 MED ORDER — BUPIVACAINE HCL (PF) 0.5 % IJ SOLN
INTRAMUSCULAR | Status: DC | PRN
  Administered 2020-03-17: 5 mL

## 2020-03-17 MED ORDER — PRENATAL MULTIVITAMIN CH
1.0000 | ORAL_TABLET | Freq: Every day | ORAL | Status: DC
  Administered 2020-03-18 – 2020-03-19 (×2): 1 via ORAL
  Filled 2020-03-17 (×2): qty 1

## 2020-03-17 MED ORDER — LIDOCAINE HCL (PF) 1 % IJ SOLN: 30.0000 mL | INTRAMUSCULAR | Status: DC | PRN

## 2020-03-17 MED ORDER — SIMETHICONE 80 MG PO CHEW
80.0000 mg | CHEWABLE_TABLET | ORAL | Status: DC | PRN
  Administered 2020-03-18: 80 mg via ORAL

## 2020-03-17 MED ORDER — OXYCODONE-ACETAMINOPHEN 5-325 MG PO TABS: 1.0000 | ORAL_TABLET | ORAL | Status: DC | PRN

## 2020-03-17 MED ORDER — LACTATED RINGERS IV SOLN
500.0000 mL | INTRAVENOUS | Status: DC | PRN
  Administered 2020-03-17: 1000 mL via INTRAVENOUS

## 2020-03-17 MED ORDER — WITCH HAZEL-GLYCERIN EX PADS: 1.0000 "application " | MEDICATED_PAD | CUTANEOUS | Status: DC | PRN

## 2020-03-17 MED ORDER — ZOLPIDEM TARTRATE 5 MG PO TABS: 5.0000 mg | ORAL_TABLET | Freq: Every evening | ORAL | Status: DC | PRN

## 2020-03-17 MED ORDER — COCONUT OIL OIL: 1.0000 "application " | TOPICAL_OIL | Status: DC | PRN

## 2020-03-17 MED ORDER — FENTANYL-BUPIVACAINE-NACL 0.5-0.125-0.9 MG/250ML-% EP SOLN: 12.0000 mL/h | EPIDURAL | Status: DC | PRN

## 2020-03-17 NOTE — H&P (Signed)
Helen Perkins is a 34 y.o. female G2P1001 [redacted]w[redacted]d presenting for labor. Patient was seen in the office yesterday and was found to be 1 cm dilated. She states that ever since the visit she has had contractions, initially irregular, but increased in intensity and became every 5 minutes overnight. Describes some brown discharge but no heavy VB and no LOF. Endorses good FM in between contractions.   Patient has history of 1 prior cesarean section for breech presentation and desires TOLAC. She has been receiving routine PNC at [facility] where she has been counseled on risks/benefits of TOLAC at each visit. Pregnancy otherwise uncomplicated. She did have an increased DS risk on Quad screen of 1:35 however Panorama was done which resulted as LR and anatomy US WNL. Fetal echo was also performed due to patient's sister with bicuspid aortic valve, and found to be WNL.  Most recent growth US performed at 38.3 with EFW 7#14 (73%)  OB History    Gravida  2   Para  1   Term  1   Preterm      AB      Living  1     SAB      TAB      Ectopic      Multiple  0   Live Births  1          Past Medical History:  Diagnosis Date  . Allergy   . Anemia   . Clostridium difficile colitis   . Depression   . Headache   . History of iron deficiency anemia   . History of migraine headaches    History of medication overuse headaches  . Mononucleosis 2010  . Psoriasis    Transient scalp psoriasis as a teenager   Past Surgical History:  Procedure Laterality Date  . BREAST ENHANCEMENT SURGERY  12/2008  . CESAREAN SECTION N/A 05/09/2018   Procedure: Primary CESAREAN SECTION;  Surgeon: Maxie Better, MD;  Location: [facility] BIR[facility] SUIT[facility];  Service: Obstetrics;  Laterality: N/A;  EDD: 05/15/18 Allergy: Neomycin, Vancomycin, Flu Vaccine  . CESAREAN SECTION  05/09/2018  . WISDOM TOOTH EXTRACTION     Family History: family history includes Coarctation of the aorta in her sister; Depression in her mother and  paternal grandfather; Diabetes in her maternal grandmother and maternal uncle; Heart attack in her paternal grandfather and paternal grandmother; Hyperlipidemia in her maternal grandmother and maternal uncle; Hypertension in her father, maternal grandfather, and maternal grandmother. Social History:  reports that she has never smoked. She has never used smokeless tobacco. She reports previous alcohol use. She reports that she does not use drugs.     Maternal Diabetes: No Genetic Screening: Abnormal:  Results: Elevated risk of Trisomy 21 on Quad, Low Risk NIPS Maternal Ultrasounds/Referrals: Normal Fetal Ultrasounds or other Referrals:  Fetal echo Maternal Substance Abuse:  No Significant Maternal Medications:  None Significant Maternal Lab Results:  Group B Strep negative Other Comments:  TOLAC  Review of Systems  All other systems reviewed and are negative.  Per HPI Maternal Exam:  Uterine Assessment: Contraction strength is moderate.  Contraction frequency is regular.   Abdomen: Patient reports no abdominal tenderness. Surgical scars: low transverse.   Fetal presentation: vertex  Introitus: Normal vulva. Normal vagina.  Pelvis: adequate for delivery.   Cervix: Cervix evaluated by digital exam.     Fetal Exam Fetal Monitor Review: Baseline rate: 140.  Variability: moderate (6-25 bpm).   Pattern: accelerations present and no decelerations.  Fetal State Assessment: Category I - tracings are normal.     Physical Exam Vitals reviewed.  Constitutional:      Appearance: Normal appearance. She is normal weight.  HENT:     Head: Normocephalic.  Cardiovascular:     Rate and Rhythm: Normal rate.  Pulmonary:     Effort: Pulmonary effort is normal.  Genitourinary:    General: Normal vulva.  Musculoskeletal:        General: Normal range of motion.     Cervical back: Normal range of motion.  Skin:    General: Skin is warm and dry.  Neurological:     Mental Status: She is  alert and oriented to person, place, and time.  Psychiatric:        Mood and Affect: Mood normal.        Behavior: Behavior normal.     Dilation: 6 Effacement (%): 90 Station: -1 Exam by:: b prichard Blood pressure 117/83, pulse 78, temperature 97.6 F (36.4 C), resp. rate 18, height 5\' 6"  (1.676 m), weight 74.4 kg, last menstrual period 06/11/2019, unknown if currently breastfeeding.  Prenatal labs: ABO, Rh:  --/--/PENDING (10/15 0423) B POS Antibody: PENDING (10/15 0423) Rubella: Immune (03/19 0000) RPR: Nonreactive (03/19 0000)  HBsAg: Negative (03/19 0000)  HIV: Non-reactive (03/19 0000)  GBS: Negative/-- (10/04 0000)   Assessment/Plan: 34Y G2P1001 @ 40.0 TOLAC GBS-/Rh+/RI  -Admit to LD in active labor -TOLAC: patient counseled on risks including but not limited to uterine rupture, emergent cesarean, and related maternal and fetal mortality -AROM clear fluid -Continuous FHT/Toco Cat I -Epidural labor pain mgmt -Routine intrapartum care -Anticipate VBAC  Shelia Kingsberry A Harold Mattes 03/17/2020, 5:30 AM

## 2020-03-17 NOTE — Progress Notes (Signed)
Labor Progress Note:  S/O: Patient now comfortable with epidural. Reports only intermittent mild rectal pressure  Vitals:   03/17/20 1001 03/17/20 1031  BP: 103/71 118/73  Pulse: 90 84  Resp: 20   Temp:    SpO2:      SVE: 9.5/100/+1   FHT: cat I baseline 140 bpm mod var +accels, -decels Toco: ctxs q 2-5 min  A/P: 34Y G2P1001 @ 40.0 TOLAC GBS-/Rh+/RI  -TOLAC in spontaneous labor, s/p AROM for clear fluid with excellent progress -Continue expectant mgmt, recheck for increase pressure and/or FHR changes  -Cont EFM/Toco -Cat I tracing -Epidural labor pain mgmt -Routine intrapartum care -Anticipate vaginal delivery  [name] A [name] 03/17/20 10:39 AM

## 2020-03-17 NOTE — Progress Notes (Signed)
Dr Conni Elliot notified of pt's  admission and status. Aware of ctx patterns, sve, hx c/s for breech and desires TOLAC. Will admit to [facility]

## 2020-03-17 NOTE — Anesthesia Preprocedure Evaluation (Signed)
Anesthesia Evaluation  Patient identified by MRN, date of birth, ID band Patient awake    Reviewed: Allergy & Precautions, NPO status , Patient's Chart, lab work & pertinent test results  Airway Mallampati: II  TM Distance: >3 FB Neck ROM: Full    Dental no notable dental hx. (+) Teeth Intact, Dental Advisory Given   Pulmonary neg pulmonary ROS,    Pulmonary exam normal breath sounds clear to auscultation       Cardiovascular negative cardio ROS Normal cardiovascular exam Rhythm:Regular Rate:Normal     Neuro/Psych  Headaches,    GI/Hepatic negative GI ROS, Neg liver ROS,   Endo/Other  negative endocrine ROS  Renal/GU negative Renal ROS  negative genitourinary   Musculoskeletal negative musculoskeletal ROS (+)   Abdominal   Peds  Hematology Lab Results      Component                Value               Date                      WBC                      10.0                03/17/2020                HGB                      13.6                03/17/2020                HCT                      41.5                03/17/2020                MCV                      90.4                03/17/2020                PLT                      167                 03/17/2020              Anesthesia Other Findings   Reproductive/Obstetrics (+) Pregnancy                             Anesthesia Physical Anesthesia Plan  ASA: II  Anesthesia Plan: Epidural   Post-op Pain Management:    Induction:   PONV Risk Score and Plan:   Airway Management Planned:   Additional Equipment:   Intra-op Plan:   Post-operative Plan:   Informed Consent: I have reviewed the patients History and Physical, chart, labs and discussed the procedure including the risks, benefits and alternatives for the proposed anesthesia with the patient or authorized representative who has indicated his/her understanding and  acceptance.       Plan Discussed with: CRNA  Anesthesia  Plan Comments: (47 Wk G2P1  TOLAC for LEA)        Anesthesia Quick Evaluation

## 2020-03-17 NOTE — MAU Note (Signed)
ctxs all day but stronger during the afternoon and evening. Some bloody show. 1cm yesterday at office. TOLAC. First c/s for breech

## 2020-03-17 NOTE — Anesthesia Procedure Notes (Signed)
Epidural Patient location during procedure: OB Start time: 03/17/2020 5:41 AM End time: 03/17/2020 5:54 AM  Staffing Anesthesiologist: Trevor Iha, MD Performed: anesthesiologist   Preanesthetic Checklist Completed: patient identified, IV checked, site marked, risks and benefits discussed, surgical consent, monitors and equipment checked, pre-op evaluation and timeout performed  Epidural Patient position: sitting Prep: DuraPrep and site prepped and draped Patient monitoring: continuous pulse ox and blood pressure Approach: midline Location: L3-L4 Injection technique: LOR air  Needle:  Needle type: Tuohy  Needle gauge: 17 G Needle length: 9 cm and 9 Needle insertion depth: 5 cm cm Catheter type: closed end flexible Catheter size: 19 Gauge Catheter at skin depth: 10 cm Test dose: negative  Assessment Events: blood not aspirated, injection not painful, no injection resistance, no paresthesia and negative IV test  Additional Notes Patient identified. Risks/Benefits/Options discussed with patient including but not limited to bleeding, infection, nerve damage, paralysis, failed block, incomplete pain control, headache, blood pressure changes, nausea, vomiting, reactions to medication both or allergic, itching and postpartum back pain. Confirmed with bedside nurse the patient's most recent platelet count. Confirmed with patient that they are not currently taking any anticoagulation, have any bleeding history or any family history of bleeding disorders. Patient expressed understanding and wished to proceed. All questions were answered. Sterile technique was used throughout the entire procedure. Please see nursing notes for vital signs. Test dose was given through epidural needle and negative prior to continuing to dose epidural or start infusion. Warning signs of high block given to the patient including shortness of breath, tingling/numbness in hands, complete motor block, or any  concerning symptoms with instructions to call for help. Patient was given instructions on fall risk and not to get out of bed. All questions and concerns addressed with instructions to call with any issues. 1 Attempt (S) . Patient tolerated procedure well.

## 2020-03-18 LAB — CBC
HCT: 29.8 % — ABNORMAL LOW (ref 36.0–46.0)
Hemoglobin: 9.9 g/dL — ABNORMAL LOW (ref 12.0–15.0)
MCH: 30.7 pg (ref 26.0–34.0)
MCHC: 33.2 g/dL (ref 30.0–36.0)
MCV: 92.3 fL (ref 80.0–100.0)
Platelets: 127 10*3/uL — ABNORMAL LOW (ref 150–400)
RBC: 3.23 MIL/uL — ABNORMAL LOW (ref 3.87–5.11)
RDW: 13.7 % (ref 11.5–15.5)
WBC: 13.1 10*3/uL — ABNORMAL HIGH (ref 4.0–10.5)
nRBC: 0 % (ref 0.0–0.2)

## 2020-03-18 MED ORDER — ACETAMINOPHEN 500 MG PO TABS
1000.0000 mg | ORAL_TABLET | Freq: Four times a day (QID) | ORAL | Status: DC
  Administered 2020-03-18 – 2020-03-19 (×5): 1000 mg via ORAL
  Filled 2020-03-18 (×5): qty 2

## 2020-03-18 MED ORDER — MAGNESIUM HYDROXIDE 400 MG/5ML PO SUSP
15.0000 mL | Freq: Every day | ORAL | Status: DC | PRN
  Administered 2020-03-18: 15 mL via ORAL
  Filled 2020-03-18: qty 30

## 2020-03-18 MED ORDER — IBUPROFEN 800 MG PO TABS
800.0000 mg | ORAL_TABLET | Freq: Four times a day (QID) | ORAL | Status: DC
  Administered 2020-03-18 – 2020-03-19 (×5): 800 mg via ORAL
  Filled 2020-03-18 (×5): qty 1

## 2020-03-18 MED ORDER — HYDROCORT-PRAMOXINE (PERIANAL) 1-1 % EX FOAM
1.0000 | Freq: Two times a day (BID) | CUTANEOUS | Status: DC
  Administered 2020-03-18 (×2): 1 via RECTAL
  Filled 2020-03-18: qty 10

## 2020-03-18 NOTE — Anesthesia Postprocedure Evaluation (Signed)
Anesthesia Post Note  Patient: AVIELLE IMBERT  Procedure(s) Performed: AN AD HOC LABOR EPIDURAL     Patient location during evaluation: Mother Baby Anesthesia Type: Epidural Level of consciousness: awake and alert, oriented and patient cooperative Pain management: pain level controlled Vital Signs Assessment: post-procedure vital signs reviewed and stable Respiratory status: spontaneous breathing Cardiovascular status: stable Postop Assessment: no headache, epidural receding, patient able to bend at knees and no signs of nausea or vomiting Anesthetic complications: no Comments: Pt. States she is walking. Pt c/o pain/pressure in perineum/bottom area. Pain score 3 when laying down and 6 when sitting. Pt. encouraged to communicate pain needs to bedside RN.  Pt. Informed of possibility of development of hemorrhoids or fracture of tail bone with delivery in addition to possibility of pain stemming from perineum stitches from 2 degree laceration.      No complications documented.  Last Vitals:  Vitals:   03/18/20 0130 03/18/20 0615  BP: 116/68 103/69  Pulse: 60 65  Resp: 14 18  Temp: 37.1 C 36.7 C  SpO2: 99%     Last Pain:  Vitals:   03/18/20 0612  TempSrc:   PainSc: 5    Pain Goal: Patients Stated Pain Goal: 4 (03/17/20 0510)                 Merrilyn Puma

## 2020-03-18 NOTE — Progress Notes (Signed)
PPD #1, SVD/VBAC, bilateral sulcus and 2nd degree perineal, baby girl "Brynlee"  S:  Reports feeling sore. C/o rectal and perineal soreness from repair. Requesting stronger pain medication as Tylenol and Motrin are not effective. Also requesting milk of magnesia for chronic constipation.              Tolerating po/ No nausea or vomiting / Denies dizziness or SOB             Bleeding is moderate             Up ad lib / ambulatory / voiding QS  Newborn breast feeding - going well   O:               VS: BP 103/69 (BP Location: Left Arm)   Pulse 65   Temp 98 F (36.7 C)   Resp 18   Ht 5\' 6"  (1.676 m)   Wt 74.4 kg   LMP 06/11/2019   SpO2 99% Comment: room air  Breastfeeding Unknown   BMI 26.47 kg/m  Patient Vitals for the past 24 hrs:  BP Temp Temp src Pulse Resp SpO2  03/18/20 0615 103/69 98 F (36.7 C) -- 65 18 --  03/18/20 0130 116/68 98.8 F (37.1 C) Oral 60 14 99 %  03/17/20 2120 117/76 98.6 F (37 C) Oral 66 18 99 %  03/17/20 1700 121/90 99.4 F (37.4 C) Oral 68 18 98 %  03/17/20 1530 110/72 99.2 F (37.3 C) Oral 66 17 --  03/17/20 1516 (!) 103/56 99.4 F (37.4 C) Axillary 72 17 --  03/17/20 1501 104/70 -- -- 79 -- --  03/17/20 1447 93/70 -- -- 81 -- --  03/17/20 1431 103/67 -- -- 69 -- --  03/17/20 1416 105/72 -- -- 92 -- --  03/17/20 1401 113/74 -- -- (!) 113 -- --  03/17/20 1351 -- -- -- -- -- 99 %  03/17/20 1346 107/90 -- -- -- -- 99 %  03/17/20 1342 98/65 -- -- -- -- 99 %  03/17/20 1341 98/65 -- -- -- -- 99 %  03/17/20 1338 101/61 -- -- (!) 113 -- --  03/17/20 1336 111/75 -- -- -- -- --  03/17/20 1335 111/75 (!) 101.2 F (38.4 C) Axillary -- 16 --  03/17/20 1334 (!) 110/91 -- -- 89 -- 99 %  03/17/20 1333 (!) 110/91 -- -- -- -- --  03/17/20 1331 92/67 -- -- 89 -- --  03/17/20 1330 (!) 70/30 -- -- (!) 113 -- --  03/17/20 1316 109/72 -- -- (!) 129 -- --  03/17/20 1315 109/72 -- -- (!) 129 -- --  03/17/20 1303 (!) 110/54 -- -- 99 -- --  03/17/20 1300 (!)  102/28 -- -- (!) 101 20 --  03/17/20 1245 (!) 138/94 -- -- -- 18 --  03/17/20 1231 121/90 -- -- (!) 176 18 --  03/17/20 1201 130/61 -- -- 76 -- --  03/17/20 1151 -- (!) 100.5 F (38.1 C) Oral -- -- --  03/17/20 1131 123/80 -- -- 87 -- --  03/17/20 1126 -- -- -- -- 20 --  03/17/20 1101 119/71 -- -- 86 20 --  03/17/20 1031 118/73 99 F (37.2 C) Oral 84 -- --  03/17/20 1001 103/71 -- -- 90 20 --  03/17/20 0931 106/73 -- -- 98 -- --  03/17/20 0901 105/66 -- -- 78 18 --  03/17/20 0831 110/82 -- -- (!) 151 20 --    LABS:  Recent Labs    03/17/20 0423 03/18/20 0435  WBC 10.0 13.1*  HGB 13.6 9.9*  PLT 167 127*               Blood type: --/--/B POS (10/15 0423)  Rubella: Immune (03/19 0000)                     I&O: Intake/Output      10/15 0701 - 10/16 0700 10/16 0701 - 10/17 0700   I.V. (mL/kg) 0 (0)    Other 0    Total Intake(mL/kg) 0 (0)    Urine (mL/kg/hr) 200 (0.1)    Blood 774    Total Output 974    Net -974         Urine Occurrence 1 x                  Physical Exam:             Alert and oriented X3  Lungs: Clear and unlabored  Heart: regular rate and rhythm / no murmurs  Abdomen: soft, non-tender, non-distended              Fundus: firm, non-tender, U-3  Perineum: well approximated 2nd degree, tender on palpation, (+) edema and ecchymosis, but no evidence of a hematoma. Non-thrombosed external hemorrhoids noted  Lochia: moderate rubra  Extremities: no edema, no calf pain or tenderness    A/P: PPD # 1, SVD/VBAC  2nd stage fever   - no evidence of infection   - afebrile since delivery  2nd degree and sulcus repair   - alternate ice packs and warm water sitz baths    External hemorrhoids   - Tucks pads and Proctofoam BID  ABL Anemia   - stable, asymptomatic   - Declines oral FE due to GI intolerance; states she generally has to get IV iron infusions, but IV has already been removed and pt does not want it replaced   - Increase dietary iron and  notify if becomes asymptomatic  Doing well - stable status  Routine post partum orders  Motrin 800mg  q6 hrs  Tylenol 1000mg  q6hrs  Milk of Magnesia 85mL daily PRN for constipation  Lactation support PRN  Abdominal binder  Anticipate d/c home tomorrow   , MSN, CNM [facility]

## 2020-03-18 NOTE — Lactation Note (Signed)
This note was copied from a baby's chart. Lactation Consultation Note  Patient Name: Helen Perkins HYIFO'Y Date: 03/18/2020  P2, 30 hour female infant -3% weight loss. LC entered the room mom and infant asleep dad sitting in chair.    Maternal Data    Feeding Feeding Type: Breast Fed  LATCH Score                   Interventions    Lactation Tools Discussed/Used     Consult Status      Helen Perkins 03/18/2020, 6:52 PM

## 2020-03-18 NOTE — Lactation Note (Signed)
This note was copied from a baby's chart. Lactation Consultation Note  Patient Name: Helen Perkins JOINO'M Date: 03/18/2020 Reason for consult: Initial assessment;Term P2, 30 hour female term infant-3% weight loss. Per mom, she BF her 7 month old daughter for one year.  Per mom she has DEBP at home.  Infant had two stools and two voids. Per mom, she had slight discomfort with BF infant and would like LC to assess latch, most feedings have been 20-30 minutes in length. LC notice a lateral nipple stripe on mom's right nipple. Mom has been using coconut oil on breast due nipple soreness and abrasions. Mom has mostly been using the cradle hold position but is open to trying the football hold. Mom latched infant on her right breast with pillow support, LC ask mom to express a small amount of colostrum out prior to latching infant at the breast, infant latched with depth, swallows could be heard by [facility] and parents, per mom, she is only feeling tug with this latch and no pain, infant was still BF after 15 minutes when LC left the room.  Mom knows that if she is having pain with latch, to break latch and re-latch infant at breast to avoid cracked nipples or abrasions. Mom will continue to BF infant according to hunger cues, 8 -12+ times within 24 hours.  Mom knows to call RN or LC if she need further assistance with latching infant at the breast. Mom made aware of O/P services, breastfeeding support groups, community resources, and our phone # for post-discharge questions.    Maternal Data Formula Feeding for Exclusion: No Has patient been taught Hand Expression?: Yes Does the patient have breastfeeding experience prior to this delivery?: Yes  Feeding Feeding Type: Breast Fed  LATCH Score Latch: Grasps breast easily, tongue down, lips flanged, rhythmical sucking.  Audible Swallowing: Spontaneous and intermittent  Type of Nipple: Everted at rest and after stimulation  Comfort  (Breast/Nipple): Soft / non-tender  Hold (Positioning): Assistance needed to correctly position infant at breast and maintain latch.  LATCH Score: 9  Interventions Interventions: Breast feeding basics reviewed;Assisted with latch;Skin to skin;Breast massage;Hand express;Position options;Support pillows;Adjust position;Breast compression;Coconut oil  Lactation Tools Discussed/Used WIC Program: No   Consult Status Consult Status: Follow-up Date: 03/19/20 Follow-up type: In-patient    Helen Perkins 03/18/2020, 8:51 PM

## 2020-03-19 ENCOUNTER — Inpatient Hospital Stay (HOSPITAL_COMMUNITY): Payer: BC Managed Care – PPO

## 2020-03-19 ENCOUNTER — Inpatient Hospital Stay (HOSPITAL_COMMUNITY): Admission: AD | Admit: 2020-03-19 | Payer: BC Managed Care – PPO | Source: Home / Self Care | Admitting: Obstetrics

## 2020-03-19 NOTE — Progress Notes (Addendum)
PPD #1, SVD/VBAC, bilateral sulcus and 2nd degree perineal, baby girl "Helen Perkins"  S:  Reports feeling sore. C/o rectal and perineal soreness from repair.  Tylenol and Motrin are effective. Camie Patience po/ No nausea or vomiting / Denies dizziness or SOB             Bleeding is moderate             Up ad lib / ambulatory / voiding QS  Newborn breast feeding - going well   O:               VS: BP 112/63 (BP Location: Left Arm)   Pulse (!) 59   Temp 97.9 F (36.6 C) (Oral)   Resp 18   Ht 5\' 6"  (1.676 m)   Wt 74.4 kg   LMP 06/11/2019   SpO2 100%   Breastfeeding Unknown   BMI 26.47 kg/m  Patient Vitals for the past 24 hrs:  BP Temp Temp src Pulse Resp SpO2  03/19/20 0348 112/63 97.9 F (36.6 C) Oral (!) 59 18 100 %  03/18/20 2158 (!) 118/53 98 F (36.7 C) Oral 65 18 --  03/18/20 1516 (!) 89/66 97.8 F (36.6 C) Oral 86 18 --    LABS:              Recent Labs    03/17/20 0423 03/18/20 0435  WBC 10.0 13.1*  HGB 13.6 9.9*  PLT 167 127*               Blood type: --/--/B POS (10/15 0423)  Rubella: Immune (03/19 0000)                     I&O: Intake/Output      10/16 0701 - 10/17 0700 10/17 0701 - 10/18 0700   I.V. (mL/kg)     Other     Total Intake(mL/kg)     Urine (mL/kg/hr)     Blood     Total Output     Net                        Physical Exam:             Alert and oriented X3  Lungs: Clear and unlabored  Heart: regular rate and rhythm / no murmurs  Abdomen: soft, non-tender, non-distended              Fundus: firm, non-tender, U-3  Perineum: well approximated 2nd degree, tender on palpation, (+) edema and ecchymosis, but no evidence of a hematoma. Non-thrombosed external hemorrhoids noted  Lochia: moderate rubra  Extremities: no edema, no calf pain or tenderness    A/P: PPD # 2 SVD/VBAC  2nd stage fever   - no evidence of infection   - afebrile since delivery  2nd degree and sulcus repair   - alternate ice packs and warm water sitz baths     External hemorrhoids   - Tucks pads and Proctofoam BID  ABL Anemia   - stable, asymptomatic   - Declines oral FE due to GI intolerance; states she generally has to get IV iron infusions, but IV has already been removed and pt does not want it replaced   - Increase dietary iron and notify if becomes asymptomatic  Doing well - stable status  Routine post partum orders  Motrin 800mg  q6 hrs  Tylenol 1000mg   q6hrs  Milk of Magnesia 24mL daily PRN for constipation  Lactation support PRN  Abdominal binder  Anticipate d/c home today

## 2020-03-19 NOTE — Lactation Note (Signed)
This note was copied from a baby's chart. Lactation Consultation Note  Patient Name: Helen Perkins KMMNO'T Date: 03/19/2020 Reason for consult: Follow-up assessment;Term;Infant weight loss  Visited with mom of a 58 hours old FT female, she's a P2 and experienced BF. Mom was concerned because she has a Hx of oversupply with her first child. Discussed some options, but let parents know that BF should first be established before attempting to work on potential oversupply issues.  Baby already nursing when entering the room, but latch wasn't deep, mom asked for some coaching on how to position baby for a deep latching, she did it twice, and on the second one, there was a big difference. Mom now knows how different the nipple should look like when baby has been latched on deep at the breast. Baby nursing with no difficulties with a big, wide open mouth, and multiple loud audible swallows noted. Baby still nursing when exiting the room at the 31 minutes mark.  Mom and baby are going home today; baby is at 6 % weight loss. Reviewed discharge instructions, engorgement prevention/treatment, treatment/prevention of sore nipples and red flags on when to call baby's pediatrician. LC rubbed some colostrum on mom's right nipple, it's still sore. She also has coconut oil.   Mom has a Medela DEBP at home and will pump after feedings if baby is not fully emptying the breast and they feel too uncomfortable. Mom was encouraged to F/U with [facility] OP services since she has a Hx of oversupply. Parents reported all questions and concerns were answered, they're both aware of LC OP services and will contact PRN.  Maternal Data    Feeding Feeding Type: Breast Fed  LATCH Score Latch: Grasps breast easily, tongue down, lips flanged, rhythmical sucking.  Audible Swallowing: Spontaneous and intermittent  Type of Nipple: Everted at rest and after stimulation  Comfort (Breast/Nipple): Filling, red/small blisters or  bruises, mild/mod discomfort  Hold (Positioning): No assistance needed to correctly position infant at breast. (no assistance after lactation coaching about positioning infant)  LATCH Score: 9  Interventions Interventions: Breast feeding basics reviewed;Assisted with latch;Skin to skin;Breast massage;Hand express;Breast compression;Adjust position;Coconut oil;Support pillows  Lactation Tools Discussed/Used Tools: Coconut oil   Consult Status Consult Status: Complete Date: 03/19/20 Follow-up type: Call as needed    Meron Bocchino Venetia Constable 03/19/2020, 1:51 PM

## 2020-03-19 NOTE — Discharge Summary (Signed)
Physician Discharge Summary  Patient ID: Helen Perkins MRN: [id number] DOB/AGE: [birth year] 33 y.o.  Admit date: 03/17/2020 Discharge date: 03/19/2020  Admission Diagnoses:labor  Discharge Diagnoses:  Principal Problem:   Postpartum care following VBAC (10/15) Active Problems:   Indication for care in labor or delivery   Second degree perineal laceration   Obstetrical laceration: bilateral sulcus   Discharged Condition: good  Hospital Course: Uncomplicated  Consults: None  Significant Diagnostic Studies: na  Treatments: na  Discharge Exam: Blood pressure 112/63, pulse (!) 59, temperature 97.9 F (36.6 C), temperature source Oral, resp. rate 18, height 5\' 6"  (1.676 m), weight 74.4 kg, last menstrual period 06/11/2019, SpO2 100 %, unknown if currently breastfeeding. General appearance: alert, cooperative and appears stated age Head: Normocephalic, without obvious abnormality, atraumatic Resp: clear to auscultation bilaterally Chest wall: no tenderness Extremities: extremities normal, atraumatic, no cyanosis or edema  Disposition:  There are no questions and answers to display.              Signed: 08/09/2019 03/19/2020, 11:22 AM

## 2020-08-22 ENCOUNTER — Encounter: Payer: Self-pay | Admitting: Physician Assistant

## 2020-08-22 ENCOUNTER — Telehealth (INDEPENDENT_AMBULATORY_CARE_PROVIDER_SITE_OTHER): Payer: BC Managed Care – PPO | Admitting: Physician Assistant

## 2020-08-22 VITALS — Temp 97.8°F | Ht 66.0 in | Wt 131.0 lb

## 2020-08-22 DIAGNOSIS — J329 Chronic sinusitis, unspecified: Secondary | ICD-10-CM

## 2020-08-22 DIAGNOSIS — J40 Bronchitis, not specified as acute or chronic: Secondary | ICD-10-CM | POA: Diagnosis not present

## 2020-08-22 MED ORDER — AMOXICILLIN-POT CLAVULANATE 875-125 MG PO TABS: 1.0000 | ORAL_TABLET | Freq: Two times a day (BID) | ORAL | 0 refills | Status: AC

## 2020-08-22 NOTE — Progress Notes (Signed)
Started last Thursday with scratchy throat/headache weekend dry cough Sunday - low grade fever 100.5 at highest, back pain, achy, chills, drainage Congestion in chest, coughing up yellow with some blood Bilateral ear pain, R > L, worse when laying down Daughter sick - being treated for pneumonia  Has been taking mucinex and tylenol Currently breast feeding

## 2020-08-22 NOTE — Progress Notes (Signed)
Patient ID: Helen Perkins, female   DOB: 1986-01-19, 35 y.o.   MRN: [id number]   .Marland KitchenVirtual Visit via Video Note  I connected with Helen Perkins on 08/22/20 at  2:40 PM EDT by a video enabled telemedicine application and verified that I am speaking with the correct person using two identifiers.  Location: Patient: home Provider: clinic  .Marland KitchenParticipating in visit:  Patient: Helen Perkins  Provider: Tandy Gaw PA-C   I discussed the limitations of evaluation and management by telemedicine and the availability of in person appointments. The patient expressed understanding and agreed to proceed.  History of Present Illness: Patient is a 35 year old female who is breast-feeding and presents to the clinic with sore throat, headache, cough that started Thursday, 6 days ago.  Sunday symptoms worsened with a low-grade fever, muscle aches, chills and loss of taste.  She tested negative for Covid twice.  She continues to have more productive cough now with sinus drainage.  She has bilateral ear pain.  She has started coughing up more yellow sputum with some blood-tinged in it.  Her 85-month-old daughter was diagnosed with pneumonia today.  She was sick before her.  She has been taking Mucinex and Tylenol with minimal relief.  Mother is breast-feeding.  Pt did have covid last month.   .. Active Ambulatory Problems    Diagnosis Date Noted  . ANXIETY 08/05/2009  . DEPRESSION 08/05/2009  . HEADACHE 08/05/2009  . Recurrent infections 06/14/2014  . Abdominal pain, left lower quadrant 07/05/2014  . CN (constipation) 07/05/2014  . Leukopenia 07/05/2014  . Thrombocytopenia (HCC) 07/05/2014  . History of Clostridium difficile colitis 07/05/2014  . Menorrhagia with regular cycle 08/30/2015  . Memory difficulties 08/30/2015  . No energy 08/30/2015  . 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 Breech presentation 05/09/2018  . Postpartum care following cesarean delivery (12/7) 05/09/2018  . 1C/S - Breech 12/7  05/09/2018  . Maternal anemia, with delivery 05/10/2018  . Indication for care in labor or delivery 03/17/2020  . Postpartum care following VBAC (10/15) 03/17/2020  . Second degree perineal laceration 03/17/2020  . Obstetrical laceration: bilateral sulcus 03/17/2020   Resolved Ambulatory Problems    Diagnosis Date Noted  . Acute pharyngitis 08/05/2009  . DIARRHEA, BLOODY 08/05/2009  . CAP (community acquired pneumonia) 05/31/2014   Past Medical History:  Diagnosis Date  . Allergy   . Anemia   . Clostridium difficile colitis   . Depression   . Headache   . History of iron deficiency anemia   . History of migraine headaches   . Mononucleosis 2010  . Psoriasis    Reviewed med, allergy, problem list.    Observations/Objective: No acute distress Normal mood and appearance No labored breathing, wheezing. Productive cough.   .. Today's Vitals   08/22/20 1434  Temp: 97.8 F (36.6 C)  TempSrc: Oral  Weight: 131 lb (59.4 kg)  Height: 5\' 6"  (1.676 m)   Body mass index is 21.14 kg/m.    Assessment and Plan: Marland KitchenBritnay was seen today for sore throat.  Diagnoses and all orders for this visit:  Sinobronchitis -     amoxicillin-clavulanate (AUGMENTIN) 875-125 MG tablet; Take 1 tablet by mouth 2 (two) times daily for 10 days.  covid negative with history of covid last month. Not vaccinated.  augmentin safe for breast feeding. Ok for Helen Perkins. Stay hydrated. Follow up as needed or if symptoms not improving.    Follow Up Instructions:    I discussed the assessment and treatment plan with the  patient. The patient was provided an opportunity to ask questions and all were answered. The patient agreed with the plan and demonstrated an understanding of the instructions.   The patient was advised to call back or seek an in-person evaluation if the symptoms worsen or if the condition fails to improve as anticipated.    Tandy Gaw, PA-C

## 2024-02-27 NOTE — Progress Notes (Signed)
 [address] [place] [address] [phone]  Follow-up   Subjective   Patient ID:  Helen Perkins is a 38 y.o. (DOB 08-Mar-1986) female.   CC:     Patient presents with   Constipation     HPI:   38 year old woman who returns for clinical follow-up for chronic constipation.  She continues to feel incomplete evacuation of her stool.  She states that she oftentimes will go multiple days without having a bowel movement.  When she does have bowel movements, she will feel some relief but then again begins experiencing symptoms.  She has felt continued right lower quadrant pain as well.  She has not recently noticed any blood in the stool.  She states that [employer] has not provided her with any relief.  She is not taking MiraLAX or any other laxatives at this time.  She denies any unintentional weight loss.  She and her husband are trying to become pregnant again.  She would like to avoid a colonoscopy if possible.  However, her husband would like for us  to perform diagnostic colonoscopy.   LOV: 12/03/23  Interval History:  Since last clinic visit, the patient notes that she still has issues with constipation and straining.  Notes that she gets a good response when she takes MiraLAX consistently daily, but does not want to get hooked on a medication.  Does note that when she eats better with high-fiber this also improves her symptoms.  Denies any blood in her stools or family history of colorectal cancer.  Does elevate her legs with a stool such as a squatty potty.  Review of Systems: A 14-point ROS was performed and is negative unless otherwise stated in the HPI.   Past Medical History:  Diagnosis Date   Anxiety     Past Surgical History:  Procedure Laterality Date   Breast surgery     Colonoscopy     15 yrs ago    Medications: Medications Taking[1]   Allergies[2]   Objective   BP 110/71 (BP Location: Left Upper Arm, Patient Position: Sitting)    Pulse 56   Temp 97.8 F (36.6 C) (Oral)   Ht 5' 7 (1.702 m)   Wt 123 lb (55.8 kg)   BMI 19.26 kg/m   General:  Alert and oriented, no acute distress Respiratory:  Clear to auscultation bilaterally. No accessory muscle use.  CV:  Regular rhythm, normal rate, no murmurs, rubs, or gallops Abd:  Good bowel sounds, soft, nontender, no masses, no hepatosplenomegaly Musculoskeletal/Ext:  Gait normal, No clubbing, cyanosis, or edema.  Psychiatric: Judgment and insight seem normal.  Does not appear depressed, anxious or agitated.    Data Reviewed     Assessment   1. Chronic constipation   2. Abdominal bloating      Plan   [name] was seen today for constipation.  Diagnoses and all orders for this visit:  Chronic constipation  Abdominal bloating     Discussion and Summary:  Helen Perkins comes to the GI clinic for further evaluation and management of chronic constipation.  I stated the patient that at this time we could entertain escalating to something such as secretagogue, but given her desire to avoid scheduled medications that we opted to try to increase her dietary fiber and I provided a worksheet with common food sources for her to reach her goal of at least 20 to 30 g/day.  In addition to this I decided that we should at least try to  increase the frequency of her bowel habits and stated that she should not go any longer than 2 days without a bowel movement.  Overall the goal should be to try to have 1 soft bowel movement per day, but at a minimum she should not go longer than 2 days and then she needs to take MiraLAX daily until she has a bowel movement.  I told her our goal would be to instill some sort of regularity and that would include taking MiraLAX on some form of scheduled basis [place] every other day or daily in order to get her more regular.  We did also discuss keeping a food diary and tracking the types of foods that she eats given she notes a lot of abdominal  bloating.  Patient agreeable to the plan approach and no further questions or concerns.  Follow up in about 3 months (around 05/28/2024) for follow up.  Patient Instructions  Please begin to increase your fiber intake with a fiber worksheet and try to get at least 20 to 30 g of dietary fiber per day with at least 64 ounces of water.  We recommend that you take a capful of MiraLAX every day if you go more than 2 days without a bowel movement.  Ultimately the goal would be to try for you to get 1 soft bowel movement per day.  I do not feel that you need to have a colonoscopy at this time and if your bowels and everything gets better, you can resume at age 41.  I recommend you also begin elevating your legs with a stool such as a squatty potty and keep doing that.    Patient's Medications       * Accurate as of February 27, 2024  4:17 PM. Reflects encounter med changes as of last refresh          Discontinued Medications    NA sulfate-potassium sulfate-magnesium  sulfate 17.5-3.13-1.6 GM/177ML Commonly known as: SUPREP BOWEL PREP Stopped by: Venetia Molt, MD                   [1] Outpatient Medications Marked as Taking for the 02/27/24 encounter (Office Visit) with Venetia Molt, MD  Medication Sig Dispense Refill   [DISCONTINUED] NA sulfate-potassium sulfate-magnesium  sulfate (SUPREP BOWEL PREP) 17.5-3.13-1.6 GM/177ML Take 177 mLs by mouth 2 (two) times. 354 mL 0  [2] Allergies Allergen Reactions   Influenza Vaccines Unknown   Neomycin Rash   Vancomycin Other    redman syndrome   *Some images could not be shown.

## 2024-02-27 NOTE — Progress Notes (Signed)
 AVS was given to the  pt, [facility]

## 2024-05-19 ENCOUNTER — Other Ambulatory Visit: Payer: Self-pay

## 2024-05-19 ENCOUNTER — Inpatient Hospital Stay (HOSPITAL_COMMUNITY)
Admission: AD | Admit: 2024-05-19 | Discharge: 2024-05-19 | Disposition: A | Discharge status: Home / Self Care | Attending: Obstetrics and Gynecology | Admitting: Obstetrics and Gynecology

## 2024-05-19 ENCOUNTER — Encounter (HOSPITAL_COMMUNITY): Payer: Self-pay | Admitting: Obstetrics and Gynecology

## 2024-05-19 DIAGNOSIS — N939 Abnormal uterine and vaginal bleeding, unspecified: Secondary | ICD-10-CM

## 2024-05-19 DIAGNOSIS — O4691 Antepartum hemorrhage, unspecified, first trimester: Secondary | ICD-10-CM | POA: Insufficient documentation

## 2024-05-19 DIAGNOSIS — R109 Unspecified abdominal pain: Secondary | ICD-10-CM | POA: Insufficient documentation

## 2024-05-19 DIAGNOSIS — Z3A10 10 weeks gestation of pregnancy: Secondary | ICD-10-CM | POA: Diagnosis not present

## 2024-05-19 DIAGNOSIS — O30041 Twin pregnancy, dichorionic/diamniotic, first trimester: Secondary | ICD-10-CM | POA: Insufficient documentation

## 2024-05-19 DIAGNOSIS — O26891 Other specified pregnancy related conditions, first trimester: Secondary | ICD-10-CM | POA: Insufficient documentation

## 2024-05-19 DIAGNOSIS — O209 Hemorrhage in early pregnancy, unspecified: Secondary | ICD-10-CM | POA: Diagnosis present

## 2024-05-19 LAB — URINALYSIS, ROUTINE W REFLEX MICROSCOPIC
Bilirubin Urine: NEGATIVE
Glucose, UA: NEGATIVE mg/dL
Ketones, ur: NEGATIVE mg/dL
Leukocytes,Ua: NEGATIVE
Nitrite: NEGATIVE
Protein, ur: NEGATIVE mg/dL
Specific Gravity, Urine: 1.003 — ABNORMAL LOW (ref 1.005–1.030)
pH: 7 (ref 5.0–8.0)

## 2024-05-19 LAB — WET PREP, GENITAL
Clue Cells Wet Prep HPF POC: NONE SEEN
Sperm: NONE SEEN
Trich, Wet Prep: NONE SEEN
WBC, Wet Prep HPF POC: >=10 — AB (ref <10)
Yeast Wet Prep HPF POC: NONE SEEN

## 2024-05-19 NOTE — Progress Notes (Signed)
 FREDRIK Dalton, NP performing bedside ultrasound to confirm cardiac activity.  Twin A - FHR 169 bpm, Twin B - FHR 171 bpm

## 2024-05-19 NOTE — MAU Note (Signed)
.  Helen Perkins is a 38 y.o. at Unknown here in MAU reporting: last night at 2130 started having braxton hicks when she would lay down it would go away, but if she was standing it would come back. This morning at 0800 started notice brown blood that's now a copper brown color. States it now a trickle LMP: 03/06/2024 Onset of complaint: yesterday  Pain score: denies  There were no vitals filed for this visit.   FHT:not attempted in triage  Lab orders placed from triage:   ua

## 2024-05-19 NOTE — MAU Provider Note (Addendum)
 S/HPI  Helen Perkins is a 38 y.o. [id number] patient who presents to MAU today with complaint of reporting last night at 2130 she started having abdominal cramping.  She states she laid down and it would resolve.  This morning at 0800 she started noticing some brown blood that was trickling from her vagina and became extremely concerned.  She has known di/di twin gestation approximately 10 weeks and 4 days and receives her prenatal care from [facility].   The remainder of the ROS is negative unless otherwise noted above in HPI  O BP 121/68 (BP Location: Right Arm)   Pulse 79   Temp 98.1 F (36.7 C)   Resp 12   Wt 57.7 kg   LMP 03/06/2024   SpO2 100%   BMI 20.52 kg/m  Physical Exam Vitals and nursing note reviewed.  Constitutional:      General: She is not in acute distress.    Appearance: Normal appearance. She is not ill-appearing.  HENT:     Nose: Nose normal.     Mouth/Throat:     Mouth: Mucous membranes are moist.  Cardiovascular:     Rate and Rhythm: Normal rate.  Pulmonary:     Effort: Pulmonary effort is normal.  Abdominal:     Palpations: Abdomen is soft.     Tenderness: There is no abdominal tenderness.  Musculoskeletal:        General: Normal range of motion.     Cervical back: Normal range of motion.  Skin:    General: Skin is warm.  Neurological:     Mental Status: She is alert and oriented to person, place, and time.  Psychiatric:        Mood and Affect: Mood normal.        Behavior: Behavior normal.     Pt informed that the ultrasound is considered a limited OB ultrasound and is not intended to be a complete ultrasound exam.  Patient also informed that the ultrasound is not being completed with the intent of assessing for fetal or placental anomalies or any pelvic abnormalities.  Explained that the purpose of todays ultrasound is to assess for  FHR  and viability.  Patient acknowledges the purpose of the exam and the limitations of the study.    ( FHR x 2  with good FM's visualized) See Media   MDM  HIGH  Available prenatal records reviewed Physical exam performed Bedside ultrasound performed for di/di twins and for fetal heart rates and viability due to inability to Doppler at early gestation UA: No evidence of UTI  Wet prep: Negative GC chlamydia cultures  No evidence of infection at this time or acute pathology FHR x 2 on BSUS  Orders Placed This Encounter  Procedures   Wet prep, genital    Standing Status:   Standing    Number of Occurrences:   1   Urinalysis, Routine w reflex microscopic -Urine, Clean Catch    Standing Status:   Standing    Number of Occurrences:   1    Specimen Source:   Urine, Clean Catch [[id number]]      Results for orders placed or performed during the hospital encounter of 05/19/24 (from the past 24 hours)  Wet prep, genital     Status: Abnormal   Collection Time: 05/19/24  2:04 PM   Specimen: PATH Cytology Cervicovaginal Ancillary Only  Result Value Ref Range   Yeast Wet Prep HPF POC NONE SEEN NONE SEEN   Trich,  Wet Prep NONE SEEN NONE SEEN   Clue Cells Wet Prep HPF POC NONE SEEN NONE SEEN   WBC, Wet Prep HPF POC >=10 (A) <10   Sperm NONE SEEN   Urinalysis, Routine w reflex microscopic -Urine, Clean Catch     Status: Abnormal   Collection Time: 05/19/24  2:06 PM  Result Value Ref Range   Color, Urine STRAW (A) YELLOW   APPearance CLEAR CLEAR   Specific Gravity, Urine 1.003 (L) 1.005 - 1.030   pH 7.0 5.0 - 8.0   Glucose, UA NEGATIVE NEGATIVE mg/dL   Hgb urine dipstick MODERATE (A) NEGATIVE   Bilirubin Urine NEGATIVE NEGATIVE   Ketones, ur NEGATIVE NEGATIVE mg/dL   Protein, ur NEGATIVE NEGATIVE mg/dL   Nitrite NEGATIVE NEGATIVE   Leukocytes,Ua NEGATIVE NEGATIVE   RBC / HPF 0-5 0 - 5 RBC/hpf   WBC, UA 0-5 0 - 5 WBC/hpf   Bacteria, UA RARE (A) NONE SEEN   Squamous Epithelial / HPF 0-5 0 - 5 /HPF     I have reviewed the patient chart and performed the physical exam . I have ordered & interpreted  the lab results and reviewed and interpreted the bedside ultrasound imaging . Please see media tab Medications ordered as stated below.  A/P as described below.  Counseling and education provided and patient agreeable  with plan as described below. Verbalized understanding.     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 Despite the best efforts of this provider to proofread and correct errors, errors may still occur which can change documentation meaning.

## 2024-05-20 LAB — GC/CHLAMYDIA PROBE AMP (~~LOC~~) NOT AT ARMC
Chlamydia: NEGATIVE
Comment: NEGATIVE
Comment: NORMAL
Neisseria Gonorrhea: NEGATIVE
